# Patient Record
Sex: Female | Born: 1965
Health system: Southern US, Community
[De-identification: ages and names within clinical notes are randomized; demographics above are authoritative.]

## PROBLEM LIST (undated history)

## (undated) DIAGNOSIS — F419 Anxiety disorder, unspecified: Secondary | ICD-10-CM

## (undated) DIAGNOSIS — I4891 Unspecified atrial fibrillation: Secondary | ICD-10-CM

## (undated) DIAGNOSIS — F17200 Nicotine dependence, unspecified, uncomplicated: Secondary | ICD-10-CM

## (undated) DIAGNOSIS — F1090 Alcohol use, unspecified, uncomplicated: Secondary | ICD-10-CM

## (undated) DIAGNOSIS — Z789 Other specified health status: Secondary | ICD-10-CM

## (undated) DIAGNOSIS — Z7289 Other problems related to lifestyle: Secondary | ICD-10-CM

---

## 2019-12-18 ENCOUNTER — Ambulatory Visit: Payer: Managed Care, Other (non HMO) | Attending: Internal Medicine

## 2019-12-18 DIAGNOSIS — Z23 Encounter for immunization: Secondary | ICD-10-CM

## 2019-12-18 NOTE — Progress Notes (Signed)
   Covid-19 Vaccination Clinic  Name:  Navi Ewton    MRN: [id number] DOB: January 11, 1966  12/18/2019  Mr. Yankee was observed post Covid-19 immunization for 15 minutes without incident. He was provided with Vaccine Information Sheet and instruction to access the V-Safe system.   Mr. Leisner was instructed to call 911 with any severe reactions post vaccine: Marland Kitchen Difficulty breathing  . Swelling of face and throat  . A fast heartbeat  . A bad rash all over body  . Dizziness and weakness   Immunizations Administered    Name Date Dose VIS Date Route   Pfizer COVID-19 Vaccine 12/18/2019  9:10 AM 0.3 mL 09/17/2019 Intramuscular   Manufacturer: [employer], [employer]   Lot: [id number]   NDC: [address]-8

## 2020-01-11 ENCOUNTER — Ambulatory Visit: Payer: Managed Care, Other (non HMO)

## 2020-01-19 ENCOUNTER — Ambulatory Visit: Payer: Managed Care, Other (non HMO) | Attending: Internal Medicine

## 2020-01-19 DIAGNOSIS — Z23 Encounter for immunization: Secondary | ICD-10-CM

## 2020-01-19 NOTE — Progress Notes (Signed)
   Covid-19 Vaccination Clinic  Name:  Jane Sparks    MRN: [id number] DOB: 12-04-1965  01/19/2020  Mr. Harm was observed post Covid-19 immunization for 15 minutes without incident. He was provided with Vaccine Information Sheet and instruction to access the V-Safe system.   [name] was instructed to call 911 with any severe reactions post vaccine: Marland Kitchen Difficulty breathing  . Swelling of face and throat  . A fast heartbeat  . A bad rash all over body  . Dizziness and weakness   Immunizations Administered    Name Date Dose VIS Date Route   Pfizer COVID-19 Vaccine 01/19/2020  2:47 PM 0.3 mL 09/17/2019 Intramuscular   Manufacturer: [employer], [employer]   Lot: [id number]   NDC: [address]-3

## 2020-03-22 ENCOUNTER — Encounter (HOSPITAL_COMMUNITY): Payer: Self-pay | Admitting: Emergency Medicine

## 2020-03-22 ENCOUNTER — Observation Stay (HOSPITAL_COMMUNITY)
Admission: EM | Admit: 2020-03-22 | Discharge: 2020-03-23 | Disposition: A | Discharge status: Home / Self Care | Payer: Managed Care, Other (non HMO) | Attending: Family Medicine | Admitting: Family Medicine

## 2020-03-22 ENCOUNTER — Emergency Department (HOSPITAL_COMMUNITY): Payer: Managed Care, Other (non HMO)

## 2020-03-22 DIAGNOSIS — I4439 Other atrioventricular block: Secondary | ICD-10-CM | POA: Diagnosis not present

## 2020-03-22 DIAGNOSIS — Z8249 Family history of ischemic heart disease and other diseases of the circulatory system: Secondary | ICD-10-CM | POA: Insufficient documentation

## 2020-03-22 DIAGNOSIS — F17210 Nicotine dependence, cigarettes, uncomplicated: Secondary | ICD-10-CM | POA: Diagnosis not present

## 2020-03-22 DIAGNOSIS — I081 Rheumatic disorders of both mitral and tricuspid valves: Secondary | ICD-10-CM | POA: Diagnosis not present

## 2020-03-22 DIAGNOSIS — Z882 Allergy status to sulfonamides status: Secondary | ICD-10-CM | POA: Diagnosis not present

## 2020-03-22 DIAGNOSIS — F419 Anxiety disorder, unspecified: Secondary | ICD-10-CM | POA: Diagnosis not present

## 2020-03-22 DIAGNOSIS — J45909 Unspecified asthma, uncomplicated: Secondary | ICD-10-CM | POA: Diagnosis not present

## 2020-03-22 DIAGNOSIS — F1010 Alcohol abuse, uncomplicated: Secondary | ICD-10-CM | POA: Diagnosis not present

## 2020-03-22 DIAGNOSIS — R000 Tachycardia, unspecified: Secondary | ICD-10-CM

## 2020-03-22 DIAGNOSIS — I4892 Unspecified atrial flutter: Secondary | ICD-10-CM | POA: Diagnosis present

## 2020-03-22 DIAGNOSIS — E7800 Pure hypercholesterolemia, unspecified: Secondary | ICD-10-CM

## 2020-03-22 DIAGNOSIS — Z880 Allergy status to penicillin: Secondary | ICD-10-CM | POA: Insufficient documentation

## 2020-03-22 DIAGNOSIS — Z7982 Long term (current) use of aspirin: Secondary | ICD-10-CM | POA: Diagnosis not present

## 2020-03-22 DIAGNOSIS — Z823 Family history of stroke: Secondary | ICD-10-CM | POA: Diagnosis not present

## 2020-03-22 DIAGNOSIS — Z79899 Other long term (current) drug therapy: Secondary | ICD-10-CM | POA: Diagnosis not present

## 2020-03-22 DIAGNOSIS — I4891 Unspecified atrial fibrillation: Secondary | ICD-10-CM | POA: Diagnosis present

## 2020-03-22 DIAGNOSIS — F410 Panic disorder [episodic paroxysmal anxiety] without agoraphobia: Secondary | ICD-10-CM | POA: Diagnosis not present

## 2020-03-22 DIAGNOSIS — Z87891 Personal history of nicotine dependence: Secondary | ICD-10-CM

## 2020-03-22 DIAGNOSIS — Z789 Other specified health status: Secondary | ICD-10-CM

## 2020-03-22 DIAGNOSIS — Z20822 Contact with and (suspected) exposure to covid-19: Secondary | ICD-10-CM | POA: Insufficient documentation

## 2020-03-22 HISTORY — DX: Unspecified atrial fibrillation: I48.91

## 2020-03-22 HISTORY — DX: Other problems related to lifestyle: Z72.89

## 2020-03-22 HISTORY — DX: Nicotine dependence, unspecified, uncomplicated: F17.200

## 2020-03-22 HISTORY — DX: Other specified health status: Z78.9

## 2020-03-22 HISTORY — DX: Alcohol use, unspecified, uncomplicated: F10.90

## 2020-03-22 HISTORY — DX: Anxiety disorder, unspecified: F41.9

## 2020-03-22 LAB — BASIC METABOLIC PANEL
Anion gap: 11 (ref 5–15)
BUN: 13 mg/dL (ref 6–20)
CO2: 21 mmol/L — ABNORMAL LOW (ref 22–32)
Calcium: 9.3 mg/dL (ref 8.9–10.3)
Chloride: 107 mmol/L (ref 98–111)
Creatinine, Ser: 0.77 mg/dL (ref 0.44–1.00)
GFR calc Af Amer: >60 mL/min (ref >60)
GFR calc non Af Amer: >60 mL/min (ref >60)
Glucose, Bld: 95 mg/dL (ref 70–99)
Potassium: 5 mmol/L (ref 3.5–5.1)
Sodium: 139 mmol/L (ref 135–145)

## 2020-03-22 LAB — SARS CORONAVIRUS 2 BY RT PCR (HOSPITAL ORDER, PERFORMED IN ~~LOC~~ HOSPITAL LAB): SARS Coronavirus 2: NEGATIVE

## 2020-03-22 LAB — TROPONIN I (HIGH SENSITIVITY): Troponin I (High Sensitivity): 10 ng/L (ref <18)

## 2020-03-22 LAB — CBC
HCT: 47.1 % — ABNORMAL HIGH (ref 36.0–46.0)
Hemoglobin: 15.8 g/dL — ABNORMAL HIGH (ref 12.0–15.0)
MCH: 29.4 pg (ref 26.0–34.0)
MCHC: 33.5 g/dL (ref 30.0–36.0)
MCV: 87.7 fL (ref 80.0–100.0)
Platelets: 347 10*3/uL (ref 150–400)
RBC: 5.37 MIL/uL — ABNORMAL HIGH (ref 3.87–5.11)
RDW: 13.7 % (ref 11.5–15.5)
WBC: 7.2 10*3/uL (ref 4.0–10.5)
nRBC: 0 % (ref 0.0–0.2)

## 2020-03-22 LAB — ETHANOL: Alcohol, Ethyl (B): <10 mg/dL (ref <10)

## 2020-03-22 LAB — HIV ANTIBODY (ROUTINE TESTING W REFLEX): HIV Screen 4th Generation wRfx: NONREACTIVE

## 2020-03-22 MED ORDER — ENOXAPARIN SODIUM 40 MG/0.4ML ~~LOC~~ SOLN
40.0000 mg | SUBCUTANEOUS | Status: DC
  Administered 2020-03-22: 40 mg via SUBCUTANEOUS
  Filled 2020-03-22: qty 0.4

## 2020-03-22 MED ORDER — NICOTINE 14 MG/24HR TD PT24: 14.0000 mg | MEDICATED_PATCH | Freq: Every day | TRANSDERMAL | Status: DC | PRN

## 2020-03-22 MED ORDER — POLYETHYLENE GLYCOL 3350 17 G PO PACK: 17.0000 g | PACK | Freq: Every day | ORAL | Status: DC | PRN

## 2020-03-22 MED ORDER — VITAMIN D 25 MCG (1000 UNIT) PO TABS
1000.0000 [IU] | ORAL_TABLET | Freq: Every day | ORAL | Status: DC
  Administered 2020-03-22 – 2020-03-23 (×2): 1000 [IU] via ORAL
  Filled 2020-03-22 (×2): qty 1

## 2020-03-22 MED ORDER — ACETAMINOPHEN 650 MG RE SUPP: 650.0000 mg | Freq: Four times a day (QID) | RECTAL | Status: DC | PRN

## 2020-03-22 MED ORDER — ACETAMINOPHEN 325 MG PO TABS
650.0000 mg | ORAL_TABLET | Freq: Four times a day (QID) | ORAL | Status: DC | PRN
  Administered 2020-03-22 – 2020-03-23 (×2): 650 mg via ORAL
  Filled 2020-03-22 (×2): qty 2

## 2020-03-22 MED ORDER — DILTIAZEM HCL 30 MG PO TABS
30.0000 mg | ORAL_TABLET | Freq: Four times a day (QID) | ORAL | Status: DC
  Administered 2020-03-22 – 2020-03-23 (×4): 30 mg via ORAL
  Filled 2020-03-22 (×4): qty 1

## 2020-03-22 MED ORDER — SODIUM CHLORIDE 0.9% FLUSH: 3.0000 mL | Freq: Once | INTRAVENOUS | Status: DC

## 2020-03-22 MED ORDER — LORATADINE 10 MG PO TABS
10.0000 mg | ORAL_TABLET | Freq: Every day | ORAL | Status: DC
  Filled 2020-03-22 (×2): qty 1

## 2020-03-22 MED ORDER — DIPHENHYDRAMINE HCL 25 MG PO CAPS: 25.0000 mg | ORAL_CAPSULE | Freq: Four times a day (QID) | ORAL | Status: DC | PRN

## 2020-03-22 MED ORDER — DILTIAZEM HCL-DEXTROSE 125-5 MG/125ML-% IV SOLN (PREMIX)
5.0000 mg/h | INTRAVENOUS | Status: DC
  Administered 2020-03-22: 5 mg/h via INTRAVENOUS
  Filled 2020-03-22: qty 125

## 2020-03-22 MED ORDER — FAMOTIDINE 20 MG PO TABS: 20.0000 mg | ORAL_TABLET | Freq: Two times a day (BID) | ORAL | Status: DC | PRN

## 2020-03-22 MED ORDER — DILTIAZEM LOAD VIA INFUSION
10.0000 mg | Freq: Once | INTRAVENOUS | Status: AC
  Administered 2020-03-22: 10 mg via INTRAVENOUS
  Filled 2020-03-22: qty 10

## 2020-03-22 MED ORDER — DIPHENHYDRAMINE HCL 25 MG PO TABS
25.0000 mg | ORAL_TABLET | Freq: Four times a day (QID) | ORAL | Status: DC | PRN
  Filled 2020-03-22: qty 1

## 2020-03-22 MED ORDER — SODIUM CHLORIDE 0.9 % IV SOLN: INTRAVENOUS | Status: DC

## 2020-03-22 NOTE — ED Triage Notes (Signed)
Pt. Stated, I woke up feeling anxious and the I checked my APPle watch and it said I was in A-fib. So I went to [facility] and they sent me here.

## 2020-03-22 NOTE — ED Provider Notes (Addendum)
[place] EMERGENCY DEPARTMENT Provider Note   CSN: [id number] Arrival date & time: 03/22/20  1035     History Chief Complaint  Patient presents with  . Dizziness  . Atrial Fibrillation    Jane Sparks is a 54 y.o. female.  Patient's Apple Watch alerted her to her heart rate being atrial fibrillation.  Patient's had the apple watch for about 9 months.  Is never dieting like this before.  Past medical history is noncontributory.  Patient is never had any heart rate problems before.  She does say that she does have a history of panic attacks.  Not listed.  So she was initially a little confused whether that was what was going on.  She did have some lightheadedness never had any chest pain.  Patient went to urgent care based on the complaint was sent here for further evaluation.  EKG done out in triage was consistent with a atrial fib flutter.  With a rapid ventricular rate.        No past medical history on file.  There are no problems to display for this patient.      No family history on file.  Social History   Tobacco Use  . Smoking status: Current Every Day Smoker  . Smokeless tobacco: Never Used  Substance Use Topics  . Alcohol use: Yes  . Drug use: Not Currently    Home Medications Prior to Admission medications   Not on File    Allergies    Penicillins and Sulfa antibiotics  Review of Systems   Review of Systems  Constitutional: Negative for chills and fever.  HENT: Negative for congestion, rhinorrhea and sore throat.   Eyes: Negative for visual disturbance.  Respiratory: Negative for cough and shortness of breath.   Cardiovascular: Positive for palpitations. Negative for chest pain and leg swelling.  Gastrointestinal: Negative for abdominal pain, diarrhea, nausea and vomiting.  Genitourinary: Negative for dysuria.  Musculoskeletal: Negative for back pain and neck pain.  Skin: Negative for rash.  Neurological: Positive for  light-headedness. Negative for dizziness and headaches.  Hematological: Does not bruise/bleed easily.  Psychiatric/Behavioral: Negative for confusion.    Physical Exam Updated Vital Signs BP (!) 128/103 (BP Location: Right Arm)   Pulse 80   Temp 98.3 F (36.8 C) (Oral)   Resp 16   Ht 1.626 m (5\' 4" )   Wt 89.8 kg   SpO2 99%   BMI 33.99 kg/m   Physical Exam Vitals and nursing note reviewed.  Constitutional:      Appearance: Normal appearance. He is well-developed.  HENT:     Head: Normocephalic and atraumatic.     Mouth/Throat:     Mouth: Mucous membranes are moist.  Eyes:     Conjunctiva/sclera: Conjunctivae normal.     Pupils: Pupils are equal, round, and reactive to light.  Cardiovascular:     Rate and Rhythm: Tachycardia present. Rhythm irregular.     Heart sounds: No murmur heard.   Pulmonary:     Effort: Pulmonary effort is normal. No respiratory distress.     Breath sounds: Normal breath sounds.  Abdominal:     Palpations: Abdomen is soft.     Tenderness: There is no abdominal tenderness.  Musculoskeletal:        General: No swelling.     Cervical back: Normal range of motion and neck supple.  Skin:    General: Skin is warm and dry.     Capillary Refill: Capillary refill takes  less than 2 seconds.  Neurological:     General: No focal deficit present.     Mental Status: He is alert and oriented to person, place, and time.     Cranial Nerves: No cranial nerve deficit.     Sensory: No sensory deficit.     Motor: No weakness.     ED Results / Procedures / Treatments   Labs (all labs ordered are listed, but only abnormal results are displayed) Labs Reviewed  CBC  BASIC METABOLIC PANEL   Results for orders placed or performed during the hospital encounter of 03/22/20  Basic metabolic panel  Result Value Ref Range   Sodium 139 135 - 145 mmol/L   Potassium 5.0 3.5 - 5.1 mmol/L   Chloride 107 98 - 111 mmol/L   CO2 21 (L) 22 - 32 mmol/L   Glucose, Bld 95  70 - 99 mg/dL   BUN 13 6 - 20 mg/dL   Creatinine, Ser 5.18 0.61 - 1.24 mg/dL   Calcium 9.3 8.9 - 84.1 mg/dL   GFR calc non Af Amer >60 >60 mL/min   GFR calc Af Amer >60 >60 mL/min   Anion gap 11 5 - 15  CBC  Result Value Ref Range   WBC 7.2 4.0 - 10.5 K/uL   RBC 5.37 4.22 - 5.81 MIL/uL   Hemoglobin 15.8 13.0 - 17.0 g/dL   HCT 66.0 39 - 52 %   MCV 87.7 80.0 - 100.0 fL   MCH 29.4 26.0 - 34.0 pg   MCHC 33.5 30.0 - 36.0 g/dL   RDW 63.0 16.0 - 10.9 %   Platelets 347 150 - 400 K/uL   nRBC 0.0 0.0 - 0.2 %     EKG None  Radiology DG Chest 2 View  Result Date: 03/22/2020 CLINICAL DATA:  Atrial fibrillation, smoker in asthma. EXAM: CHEST - 2 VIEW COMPARISON:  None. FINDINGS: The heart size and mediastinal contours are within normal limits. The lungs are clear. No pneumothorax or pleural effusion. The visualized skeletal structures are unremarkable. IMPRESSION: No active cardiopulmonary disease. Electronically Signed   By: Emmaline Kluver M.D.   On: 03/22/2020 11:23    Procedures Procedures (including critical care time)  CRITICAL CARE Performed by: Vanetta Mulders Total critical care time: 45 minutes Critical care time was exclusive of separately billable procedures and treating other patients. Critical care was necessary to treat or prevent imminent or life-threatening deterioration. Critical care was time spent personally by me on the following activities: development of treatment plan with patient and/or surrogate as well as nursing, discussions with consultants, evaluation of patient's response to treatment, examination of patient, obtaining history from patient or surrogate, ordering and performing treatments and interventions, ordering and review of laboratory studies, ordering and review of radiographic studies, pulse oximetry and re-evaluation of patient's condition.   Medications Ordered in ED Medications  sodium chloride flush (NS) 0.9 % injection 3 mL (has no  administration in time range)  0.9 %  sodium chloride infusion (has no administration in time range)  diltiazem (CARDIZEM) 1 mg/mL load via infusion 10 mg (has no administration in time range)    And  diltiazem (CARDIZEM) 125 mg in dextrose 5% 125 mL (1 mg/mL) infusion (has no administration in time range)    ED Course  I have reviewed the triage vital signs and the nursing notes.  Pertinent labs & imaging results that were available during my care of the patient were reviewed by me and considered in my  medical decision making (see chart for details).    MDM Rules/Calculators/A&P                         EKG done back in the recess room showed atrial fib flutter.  With heart rate anywhere from 1 10-1 50.  Chest x-ray negative.  Electrolytes and complete metabolic panel without any significant abnormalities.  Patient will be given a 10 mg diltiazem bolus and then started on diltiazem drip.  Patient with known prior history of atrial fibrillation or flutter.  Patient's had both Covid vaccines the last one was about 5 weeks ago.  Patient without any Covid symptoms.  We will contact unassigned medicine service for admission.  Once we see that her heart rate is stabilized with the diltiazem drip.  Patient responding well with his diltiazem drip.  Still in atrial fibrillation.  But heart rate down into the 80s.  Discussed with family medicine for admission.  Final Clinical Impression(s) / ED Diagnoses Final diagnoses:  Atrial fibrillation and flutter [facility])    Rx / DC Orders ED Discharge Orders    None       Vanetta Mulders, MD 03/22/20 1202    Vanetta Mulders, MD 03/22/20 1257

## 2020-03-22 NOTE — Progress Notes (Signed)
Patient listed in chart as female.  Patient seen at bedside.  Discussed with patient and she reports that she is a female and was born as a female.  Will update demographics accordingly.  Luis Abed, D.O.  PGY-2 Family Medicine  03/22/2020 9:00 PM

## 2020-03-22 NOTE — ED Notes (Signed)
Attempted to call nursing report to [phone]  ?

## 2020-03-22 NOTE — H&P (Addendum)
Family Medicine Teaching [facility] Admission History and Physical Service Pager: [phone]  Patient name: Jane Sparks Medical record number: [id number] Date of birth: 01-23-1966 Age: 54 y.o. Gender: female  Primary Care Provider: Patient, No Pcp Per Consultants: cardiology  Code Status: DNR Preferred Emergency Contact: Andrey Campanile, husband, [phone]  Chief Complaint: Dizziness  Assessment and Plan: Jane Sparks is a 54 y.o. presenting for apple watch reporting atrial fibrillation/flutter. PMH is significant for anxiety, tobacco use disorder, alcohol use disorder.   New onset atrial flutter/fibrillation with RVR  Reports that she was having an argument with her mom last night and woke up this morning still angry. Patient had apple watch alert stating she was in atrial fibrillation around 730 this morning.  She felt as though she was anxious so she took 2 CBD pills but that did not cause any relief.  Her apple watch continue to alert her that she was in atrial flutter therefore she sought immediate care.  She was seen briefly at urgent care and sent to the ED.  EKG concerning for atrial fib/flutter with variable AV block (2-4:1).  Chest x-ray showed no active cardiopulmonary disease.  Patient given a 10 mg diltiazem bolus and then started on diltiazem drip.  Heart rate down trended from the 160s to the 80s.  Patient is asymptomatic, denying chest discomfort, palpitations, shortness of breath, previous similar symptoms, recent increased salt intake and recent drug use.  Endorses anxiety. Less likely infection given lack of infectious symptoms.  Not likely related to anemia as hemoglobin 15.8.  Electrolytes were within normal range. Patient denies history of PE/DVT and recent travel. Well score 1.5 No known hx of CHF and hsTroponin was10, less likely ACS.  Patient with a remote drug use.  UDS pending.  She drinks 2 alcoholic beverages in the evening.  Etiology of new onset atrial fibrillation  unclear, though could be secondary to anxiety or alcohol use.  CHADs VASc score of 1, low moderate risk.  Will discuss risk versus benefits of starting anticoagulation.  Patient may be amendable to this as her mother had a CVA and also has A. fib. -Admit to cardiac telemetry for observation, attending Dr. Deirdre Priest -Consider cardiology consult, if RVR returns -Continue Cardizem gtt, transition to oral therapy -Consider to DOAC -Monitor electrolytes, Mg >2, K >4 -Follow-up TSH -Follow-up ECHO -A1c -Lipid panel -UDS -Ethanol level -AM CBC, BMP -Continuous cardiopulmonary monitoring -Lovenox for VT prophylaxis -Vitals per unit routine -Regular diet  Allergic rhinitis Patient states she takes Claritin and Benadryl.  Reports congestion on admission. -Continue Claritin  Anxiety  Panic disorder Patient uses 2 pills of CBD when she feels anxious as needed.  Took 2 pills this morning.  Offered Atarax however patient declined. -Continue to monitor -Consider starting SSRI  Tobacco Use Disorder Patient smoking 1/2 ppd for the past 20 years.  Did quit while she was pregnant. - Nicotine patch available upon request   Alcohol use disorder Reports drinking 2 drinks per night.  Last drink last night.  Denies recent heavy drinking.  -Follow-up EtOH -Consider CIWA's.  FEN/GI: Regular diet, replete electrolytes as needed Prophylaxis: Lovenox   Disposition: Mid to cardiac telemetry  History of Present Illness:  Jane Sparks is a 54 y.o. with atrial fibrillation.  She has a previous medical history significant for anxiety and allergies.  Jane Sparks reports that she had an upsetting argument with her mother last night which left her anxious and angry.  She went to bed and woke up  this morning with continued anxiety and anger.  As these emotions persisted through the morning, she decided to check her pulse with her apple watch which showed that she was in atrial fibrillation.  At the behest of  her watch she presented to urgent care who immediately transported her to [facility] ED via ambulance.    Interaction with the medical field is very difficult for Jane Sparks because of the suffering her mother has gone through.  Her mother has suffered a severe stroke secondary to atrial fibrillation which is caused her about one third of her brain.  As result, she remains somewhat fearful and anxious about medicine and has taken significant steps to avoid seeking medical care for at least the past 10 years.  ED EKG concerning for atrial fibrillation/flutter with RVR.  Patient started on Cardizem gtt.  Admitted for overnight observation for resolution of her tachycardia.  Review Of Systems: Per HPI with the following additions:   Review of Systems  Constitutional: Negative for fever.  HENT: Positive for congestion. Negative for sore throat.   Eyes: Negative for blurred vision.  Respiratory: Negative for shortness of breath.   Cardiovascular: Negative for chest pain and palpitations.  Gastrointestinal: Negative for nausea and vomiting.  Genitourinary: Negative for dysuria.  Musculoskeletal: Negative for back pain and myalgias.  Skin: Negative for rash.  Neurological: Negative for dizziness and headaches.  Psychiatric/Behavioral: The patient is nervous/anxious.     Patient Active Problem List   Diagnosis Date Noted  . Atrial flutter ([place]) 03/22/2020    Past Medical History: No past medical history on file.  Past Surgical History: No recent surgeries.   Social History: Social History   Tobacco Use  . Smoking status: Current Every Day Smoker  . Smokeless tobacco: Never Used  Substance Use Topics  . Alcohol use: Yes  . Drug use: Not Currently   Additional social history: drinks two drinks/night. Drinks 1/2 PPD Please also refer to relevant sections of EMR.  Family History: Family History  Problem Relation Age of Onset  . Atrial fibrillation Mother   . CVA Mother   .  Heart disease Father    Mom - A-fib, CVA Dad - died of heart complications  Allergies and Medications: Allergies  Allergen Reactions  . Penicillins   . Sulfa Antibiotics    No current facility-administered medications on file prior to encounter.   Current Outpatient Medications on File Prior to Encounter  Medication Sig Dispense Refill  . aspirin EC 81 MG tablet Take 81 mg by mouth once. Swallow whole.    . calcium carbonate (TUMS - DOSED IN MG ELEMENTAL CALCIUM) 500 MG chewable tablet Chew 1 tablet by mouth daily as needed for indigestion or heartburn.    . cetirizine (ZYRTEC) 10 MG tablet Take 10 mg by mouth daily.    . cholecalciferol (VITAMIN D3) 25 MCG (1000 UNIT) tablet Take 1,000 Units by mouth daily.    . diphenhydrAMINE (BENADRYL) 25 MG tablet Take 25 mg by mouth every 6 (Sparks) hours as needed for allergies.    . famotidine (PEPCID) 20 MG tablet Take 20 mg by mouth 2 (two) times daily as needed for heartburn or indigestion.    Marland Kitchen OVER THE COUNTER MEDICATION Take 1 each by mouth See admin instructions. "CBD gummy" can take up to 5 daily.      Objective: BP 135/72 (BP Location: Right Arm)   Pulse (!) 51   Temp 97.7 F (36.5 C) (Oral)   Resp 18  Ht 5\' 4"  (1.626 m)   Wt 87.1 kg   SpO2 100%   BMI 32.96 kg/m   Exam: GEN:     alert, very anxious,  sitting upright in bed HENT:  mucus membranes moist, oropharyngeal without lesions or erythema,  nares patent, no nasal discharge  EYES:   pupils equal and reactive, EOM intact NECK:  supple, normal ROM, no lymphadenopathy, no goiter RESP:  clear to auscultation bilaterally, no increased work of breathing CVS:  Irregularly irregular rhythm, normal rate, no murmurs, rubs or gallops ABD:  soft, non-tender; bowel sounds present; no palpable masses EXT:   normal ROM, atraumatic, no lower extremity edema NEURO:  normal without focal findings,  speech normal, alert and oriented  Skin:   warm and dry, normal skin turgor Psych:  Anxious    Labs and Imaging: CBC BMET  Recent Labs  Lab 03/22/20 1105  WBC 7.2  HGB 15.8  HCT 47.1  PLT 347   Recent Labs  Lab 03/22/20 1105  NA 139  K 5.0  CL 107  CO2 21*  BUN 13  CREATININE 0.77  GLUCOSE 95  CALCIUM 9.3     EKG: Atrial fibrillation/flutter, variable AV block, HR 123    DG Chest 2 View  Result Date: 03/22/2020 CLINICAL DATA:  Atrial fibrillation, smoker in asthma. EXAM: CHEST - 2 VIEW COMPARISON:  None. FINDINGS: The heart size and mediastinal contours are within normal limits. The lungs are clear. No pneumothorax or pleural effusion. The visualized skeletal structures are unremarkable. IMPRESSION: No active cardiopulmonary disease. Electronically Signed   By: 03/24/2020 M.D.   On: 03/22/2020 11:23   03/24/2020, DO 03/22/2020, 8:21 PM PGY-1, [facility] FPTS Intern pager: [phone], text pages welcome  FPTS Upper-Level Resident Addendum   I have independently interviewed and examined the patient. I have discussed the above with the original author and agree with their documentation. My edits for correction/addition/clarification are in blue. Please see also any attending notes.    [phone] MD PGY-2, [place] 03/22/2020 8:22 PM  FPTS Service pager: [phone] (text pages welcome through [facility])

## 2020-03-22 NOTE — Hospital Course (Addendum)
  New onset atrial fibrillation/flutter Reports that she was having an argument with her mom last night and woke up this morning still angry. Patient had apple watch alert stating she was in atrial fibrillation around 730 this morning.  She felt as though she was anxious so she took 2 CBD pills but that did not cause any relief.  Her apple watch continue to alert her that she was in atrial flutter therefore she sought immediate care.  She was seen briefly at urgent care and sent to the ED.  EKG concerning for atrial fib/flutter.  Chest x-ray showed no active cardiopulmonary disease.  Patient given a 10 mg diltiazem bolus and then started on diltiazem drip.  Heart rate down trended from the 160s to the 80s.  Patient is asymptomatic, denying chest discomfort, palpitations, shortness of breath, previous similar symptoms, recent increased salt intake and recent drug use.  Endorses anxiety. Less likely infection given lack of infectious symptoms.  Not likely related to anemia as hemoglobin 15.8.  Electrolytes were within normal range. Patient denies history of PE/DVT and recent travel. Well score 1.5 No known hx of CHF and hsTroponin was10, less likely ACS.  Patient with a remote drug use.  UDS pending.  She drinks 2 alcoholic beverages in the evening.   Cardiology evaluated patient and recommended ***   Repeat CBC at follow up for concern for polycythemia on admission. If remains elevated, consider further work up for OSA.  Continue to encourage smoking cessation. Highly recommend stopping CBD supplementation as patient at increased risk of bleeding on Eliquis.  We recommend stopping Aspirin. You are being started on Eliquis to prevent clots due to your irregular heart rhythm due to Atrial flutter / fibrillation.  Continue taking your Cardizem 120 mg capsules daily to help with your heart rate.  Dr. Homero Fellers offered to be the patient's PCP.

## 2020-03-23 ENCOUNTER — Encounter (HOSPITAL_COMMUNITY): Payer: Self-pay

## 2020-03-23 ENCOUNTER — Observation Stay (HOSPITAL_BASED_OUTPATIENT_CLINIC_OR_DEPARTMENT_OTHER): Payer: Managed Care, Other (non HMO)

## 2020-03-23 ENCOUNTER — Encounter (HOSPITAL_COMMUNITY): Payer: Self-pay | Admitting: Family Medicine

## 2020-03-23 DIAGNOSIS — R000 Tachycardia, unspecified: Secondary | ICD-10-CM

## 2020-03-23 DIAGNOSIS — Z87891 Personal history of nicotine dependence: Secondary | ICD-10-CM

## 2020-03-23 DIAGNOSIS — F419 Anxiety disorder, unspecified: Secondary | ICD-10-CM

## 2020-03-23 DIAGNOSIS — I340 Nonrheumatic mitral (valve) insufficiency: Secondary | ICD-10-CM

## 2020-03-23 DIAGNOSIS — Z720 Tobacco use: Secondary | ICD-10-CM

## 2020-03-23 DIAGNOSIS — I361 Nonrheumatic tricuspid (valve) insufficiency: Secondary | ICD-10-CM | POA: Diagnosis not present

## 2020-03-23 DIAGNOSIS — Z789 Other specified health status: Secondary | ICD-10-CM

## 2020-03-23 DIAGNOSIS — I4892 Unspecified atrial flutter: Secondary | ICD-10-CM | POA: Diagnosis not present

## 2020-03-23 DIAGNOSIS — E7800 Pure hypercholesterolemia, unspecified: Secondary | ICD-10-CM

## 2020-03-23 DIAGNOSIS — Z7289 Other problems related to lifestyle: Secondary | ICD-10-CM

## 2020-03-23 DIAGNOSIS — I4891 Unspecified atrial fibrillation: Secondary | ICD-10-CM | POA: Diagnosis not present

## 2020-03-23 LAB — BASIC METABOLIC PANEL
Anion gap: 11 (ref 5–15)
BUN: 11 mg/dL (ref 6–20)
CO2: 19 mmol/L — ABNORMAL LOW (ref 22–32)
Calcium: 9 mg/dL (ref 8.9–10.3)
Chloride: 108 mmol/L (ref 98–111)
Creatinine, Ser: 0.84 mg/dL (ref 0.44–1.00)
GFR calc Af Amer: >60 mL/min (ref >60)
GFR calc non Af Amer: >60 mL/min (ref >60)
Glucose, Bld: 93 mg/dL (ref 70–99)
Potassium: 3.9 mmol/L (ref 3.5–5.1)
Sodium: 138 mmol/L (ref 135–145)

## 2020-03-23 LAB — RAPID URINE DRUG SCREEN, HOSP PERFORMED
Amphetamines: NOT DETECTED
Barbiturates: NOT DETECTED
Benzodiazepines: NOT DETECTED
Cocaine: NOT DETECTED
Opiates: NOT DETECTED
Tetrahydrocannabinol: NOT DETECTED

## 2020-03-23 LAB — LIPID PANEL
Cholesterol: 165 mg/dL (ref 0–200)
HDL: 32 mg/dL — ABNORMAL LOW (ref >40)
LDL Cholesterol: 108 mg/dL — ABNORMAL HIGH (ref 0–99)
Total CHOL/HDL Ratio: 5.2 RATIO
Triglycerides: 127 mg/dL (ref <150)
VLDL: 25 mg/dL (ref 0–40)

## 2020-03-23 LAB — ECHOCARDIOGRAM COMPLETE
Height: 64 in
Weight: 3056 oz

## 2020-03-23 LAB — MAGNESIUM: Magnesium: 1.8 mg/dL (ref 1.7–2.4)

## 2020-03-23 LAB — TSH: TSH: 3.062 u[IU]/mL (ref 0.350–4.500)

## 2020-03-23 MED ORDER — MAGNESIUM SULFATE 2 GM/50ML IV SOLN
2.0000 g | Freq: Once | INTRAVENOUS | Status: AC
  Administered 2020-03-23: 2 g via INTRAVENOUS
  Filled 2020-03-23: qty 50

## 2020-03-23 MED ORDER — APIXABAN 5 MG PO TABS: 5.0000 mg | ORAL_TABLET | Freq: Two times a day (BID) | ORAL | 0 refills | Status: DC

## 2020-03-23 MED ORDER — NICOTINE 14 MG/24HR TD PT24: 14.0000 mg | MEDICATED_PATCH | Freq: Every day | TRANSDERMAL | 0 refills | Status: DC | PRN

## 2020-03-23 MED ORDER — DILTIAZEM HCL ER COATED BEADS 120 MG PO CP24: 120.0000 mg | ORAL_CAPSULE | Freq: Every day | ORAL | 3 refills | Status: DC

## 2020-03-23 MED ORDER — APIXABAN 5 MG PO TABS
5.0000 mg | ORAL_TABLET | Freq: Two times a day (BID) | ORAL | Status: DC
  Administered 2020-03-23: 5 mg via ORAL
  Filled 2020-03-23: qty 1

## 2020-03-23 MED FILL — CARTIA XT 120 MG CP24: 120 | 30 days supply | Qty: 30 | Fill #0

## 2020-03-23 MED FILL — NICOTINE 14 MG/24HR PATCH: 14 | 28 days supply | Qty: 28 | Fill #0

## 2020-03-23 MED FILL — ELIQUIS 5 MG TABLET: 5 | 30 days supply | Qty: 60 | Fill #0

## 2020-03-23 NOTE — Care Management (Signed)
Message sent to [facility] regarding eligibility pre authorization for Eliquis.

## 2020-03-23 NOTE — TOC Benefit Eligibility Note (Signed)
Transition of Care [facility]) Benefit Eligibility Note    Patient Details  Name: Santos Hardwick MRN: [id number] Date of Birth: [birth year]   Medication/Dose: Eliquis 58m.bid  Covered?: No        Spoke with Person/Company/Phone Number:: JSkipper Cliche W/[employer] [phone]    Prior Approval: Yes [phone]  Deductible: MBelenda CruisePhone Number: 03/23/2020, 10:47 AM

## 2020-03-23 NOTE — Progress Notes (Signed)
  Echocardiogram 2D Echocardiogram has been performed.  Jane Sparks 03/23/2020, 8:51 AM

## 2020-03-23 NOTE — Care Management Note (Cosign Needed)
Case Management Note  Patient Details  Name: Jane Sparks MRN: [id number] Date of Birth: [birth year]  Subjective/Objective:                  Pre- Authorization from MD required by insurance prior to discharge for eliquis  Action/Plan: Please call [phone] to pre authorize  Expected Discharge Date:  03/23/20              Expected Discharge Plan:     In-House Referral:     Discharge planning Services     Post Acute Care Choice:    Choice offered to:     DME Arranged:    DME Agency:     HH Arranged:    HH Agency:     Status of Service:     If discussed at [employer], dates discussed:    Additional Comments:  Lockie Pares, RN 03/23/2020, 3:33 PM

## 2020-03-23 NOTE — Progress Notes (Addendum)
Family Medicine Teaching Service Daily Progress Note Intern Pager: [phone]  Patient name: Jane Sparks Medical record number: [id number] Date of birth: [birth year] Age: 54 y.o. Gender: female  Primary Care Provider: Patient, No Pcp Per Consultants: None Code Status: DNR  Pt Overview and Major Events to Date:  03/22/20: Admitted, cardizem gtt    Assessment and Plan: Marabella Popiel is a 54 y.o. female who presented  for apple watch reporting atrial fibrillation/flutter. PMH is significant for anxiety, tobacco use disorder, alcohol use disorder.   New onset atrial flutter/fibrillation with RVR, variable AV block  Pt's with asymptomatic new onset atrial flutter.  Patient converted to oral therapy (cadizem 30 mg q6h) around 1700 and Cardizeim gtt discontinued around 1900. HR reached 140s (per tele) since transitioning. ECHO tech in the room, this morning. Patient eager to leave. Will consult cardiology to help with dosing. AM EKG not completed as of yet. Etiology not infectious, related to anemia, not drug associated as UDS unremarkable. EtOH level unremarkable. Electrolytes remain stable.  High-sensitivity troponin not consistent with ACS.  Not likely related to hypothyroidism.  TSH 3.06.  Patient reports a start [place] this morning. -Cardiology consulted, appreciate recommended  -Continue Cardizem  -Start Eliquis -Monitor electrolytes, Mg >2, K >4 -Follow-up ECHO -Follow-up A1c -AM labs: CBC, BMP, Mg -Continuous cardiopulmonary monitoring  Code Status  Patient very anxious yesterday but was adamant she was DNR. - reassess code status.   Health maintenance Patient has not seen a physician in over 10 years.  Not have a PCP. Offer to follow up with [facility] and patient agreeable.  Lipid panel: Total cholesterol 165, triglycerides 127, HDL 32, LDL 108 A1c: Pending - outpt colonoscopy   Allergic rhinitis Patient states she takes Claritin and Benadryl.  Reported congestion on  admission. -Continue Claritin daily, Benadryl prn  Anxiety  Panic disorder Patient uses 2 pills of CBD when she feels anxious as needed. Offered Atarax however patient declined. -Continue to monitor -Consider starting SSRI  Tobacco Use Disorder Patient smoking 1/2 ppd for the past 20 years.  - Nicotine patch available upon request   Alcohol use disorder Reports drinking 2 drinks per night.  Last drink 6/15.  EtOH negative.   -Consider CIWA's.  FEN/GI: Regular diet, replete electrolytes as needed Prophylaxis: Lovenox   Disposition: likely home pending medical optimization   Subjective:  Patient remains anxious this morning.  Ready to leave the hospital.  Objective: Temp:  [97.6 F (36.4 C)-98.3 F (36.8 C)] 97.6 F (36.4 C) (06/16 2336) Pulse Rate:  [26-197] 52 (06/16 2336) Resp:  [11-34] 18 (06/16 2013) BP: (102-198)/(52-177) 107/52 (06/16 2336) SpO2:  [93 %-100 %] 98 % (06/16 2336) Weight:  [87.1 kg-89.8 kg] 87.1 kg (06/16 1546) Physical Exam: General: Anxious, alert, no acute distress Cardiovascular: Irregularly irregular rhythm, normal rate, no murmurs, rubs or gallops Respiratory: There to auscultation, no increased work of breathing Abdomen: Soft, nontender nondistended Extremities: Lower extremity edema, nontender  Laboratory: Recent Labs  Lab 03/22/20 1105  WBC 7.2  HGB 15.8*  HCT 47.1*  PLT 347   Recent Labs  Lab 03/22/20 1105  NA 139  K 5.0  CL 107  CO2 21*  BUN 13  CREATININE 0.77  CALCIUM 9.3  GLUCOSE 95     Imaging/Diagnostic Tests: DG Chest 2 View  Result Date: 03/22/2020 CLINICAL DATA:  Atrial fibrillation, smoker in asthma. EXAM: CHEST - 2 VIEW COMPARISON:  None. FINDINGS: The heart size and mediastinal contours are within normal limits. The lungs are  clear. No pneumothorax or pleural effusion. The visualized skeletal structures are unremarkable. IMPRESSION: No active cardiopulmonary disease. Electronically Signed   By: Emmaline Kluver M.D.   On: 03/22/2020 11:23     Katha Cabal, DO 03/23/2020, 1:20 AM PGY-1, [facility] FPTS Intern pager: [phone], text pages welcome

## 2020-03-23 NOTE — Progress Notes (Signed)
Around 0537, A.fib with RVR (130s-150s) noted less than 30 seconds. Vital signs are stable. Patient is awake and watching a movie. Patient denies any discomfort. Scheduled Cardizem 30 mg po given. Heart rate is between 60s-100s at this time. Will continue to assess.

## 2020-03-23 NOTE — Discharge Summary (Signed)
Family Medicine Teaching [facility] Discharge Summary  Patient name: Jane Sparks Medical record number: [id number] Date of birth: [birth year] Age: 54 y.o. Gender: female Date of Admission: 03/22/2020  Date of Discharge: 03/23/20  Admitting Physician: Katha Cabal, DO  Primary Care Provider: Mirian Mo, MD Consultants: Cardiology  Indication for Hospitalization: Atrial fibrillation/flutter  Discharge Diagnoses/Problem List:  Atrial fibrillation/flutter Alcohol use disorder Tobacco use disorder Panic disorder Anxiety Allergic rhinitis  Disposition: Home  Discharge Condition: Stable  Discharge Exam:  General: Anxious, alert, no acute distress Cardiovascular: Irregularly irregular rhythm, normal rate, no murmurs, rubs or gallops Respiratory: There to auscultation, no increased work of breathing Abdomen: Soft, nontender nondistended Extremities: Lower extremity edema, non-tender Taken Dr. Lezlie Octave progress note on day of discharge   Brief Hospital Course:     New onset atrial fibrillation/flutter Jane Sparks is a 54 y.o. female who presented  for apple watch reporting atrial fibrillation/flutter. PMH is significant foranxiety, tobacco use disorder,alcohol use disorder   New onset atrial flutter/fibrillation with RVR, variable AV block  On the day of admission, patient's Apple Watch continuously told her she was in atrial flutter. She was seen briefly at urgent care and sent to the ED. ED EKG was concerning for atrial fib/flutter. Chest x-ray showed no active cardiopulmonary disease.  Patient given a 10 mg diltiazem bolus and then started on diltiazem drip.  Heart rate down trended from the 160s to the 80s..  Patient remained asymptomatic. Patient converted to oral therapy (cadizem 30 mg q6h) around 1700 and Cardizeim gtt discontinued around 1900. HR reached 140s (per telemetry) since transitioning.  Patient started on DOAC.  Cardiology evaluated patient and  recommended cTEE however patient declined.  Patient transitioned to Cardizem 120 mg capsules daily.  ECHO with EF 55% and inferior basal hypokinesis.  In the setting of atrial flutter, the inferior basal area is poorly visualized.  Plan to repeat ECHO outpatient. Plan for patient to follow up in AFIB clinic for potential cardioversion, if not already converted to sinus rhythm.   Health maintenance Dr. Homero Fellers offered to be the patient's PCP. Patient to follow up in [facility] clinc.  Issues for Follow Up:   Cardiology plans to follow up in AFIB clinic for potential cardioversion and medication mangement, if not in sinus rhythm.    Repeat CBC at follow up for concern for polycythemia on admission. If remains elevated, consider further work up for OSA.   Continue to encourage smoking cessation.  Highly recommend stopping CBD and ASA use as patient at increased risk of bleeding on Eliquis.   Recommend starting SSRI for anxiety.  Follow up a1c.  Patient needs colonoscopy and mammogram.   Significant Procedures:   Significant Labs and Imaging:  Recent Labs  Lab 03/22/20 1105  WBC 7.2  HGB 15.8*  HCT 47.1*  PLT 347   Recent Labs  Lab 03/22/20 1105 03/23/20 0411 03/23/20 1122  NA 139  --  138  K 5.0  --  3.9  CL 107  --  108  CO2 21*  --  19*  GLUCOSE 95  --  93  BUN 13  --  11  CREATININE 0.77  --  0.84  CALCIUM 9.3  --  9.0  MG  --  1.8  --     ECHOCARDIOGRAM COMPLETE  Result Date: 03/23/2020    ECHOCARDIOGRAM REPORT   Patient Name:   Jane Sparks Date of Exam: 03/23/2020 Medical Rec #:  [id number]     Height:  64.0 in Accession #:    [id number]    Weight:       191.0 lb Date of Birth:  Jan 14, 1966    BSA:          1.918 m Patient Age:    53 years      BP:           127/70 mmHg Patient Gender: F             HR:           79 bpm. Exam Location:  Inpatient Procedure: 2D Echo, Cardiac Doppler and Color Doppler Indications:    Atrial Flutter 427.32 / I48.92  History:         Patient has no prior history of Echocardiogram examinations.                 Arrythmias:Atrial Flutter; Risk Factors:Current Smoker.  Sonographer:    Renella Cunas RDCS Referring Phys: [id number] SCOTT ZACKOWSKI IMPRESSIONS  1. Inferior basal hypokinesis . Left ventricular ejection fraction, by estimation, is 55%. The left ventricle has normal function. The left ventricle demonstrates regional wall motion abnormalities (see scoring diagram/findings for description). Left ventricular diastolic parameters are indeterminate.  2. Right ventricular systolic function is normal. The right ventricular size is normal. There is normal pulmonary artery systolic pressure.  3. Left atrial size was moderately dilated.  4. The mitral valve is normal in structure. Mild mitral valve regurgitation. No evidence of mitral stenosis.  5. The aortic valve is tricuspid. Aortic valve regurgitation is trivial. Mild aortic valve sclerosis is present, with no evidence of aortic valve stenosis.  6. The inferior vena cava is normal in size with greater than 50% respiratory variability, suggesting right atrial pressure of 3 mmHg. FINDINGS  Left Ventricle: Inferior basal hypokinesis. Left ventricular ejection fraction, by estimation, is 55%. The left ventricle has normal function. The left ventricle demonstrates regional wall motion abnormalities. The left ventricular internal cavity size was normal in size. There is no left ventricular hypertrophy. Left ventricular diastolic parameters are indeterminate. Right Ventricle: The right ventricular size is normal. No increase in right ventricular wall thickness. Right ventricular systolic function is normal. There is normal pulmonary artery systolic pressure. The tricuspid regurgitant velocity is 1.85 m/s, and  with an assumed right atrial pressure of 3 mmHg, the estimated right ventricular systolic pressure is 16.7 mmHg. Left Atrium: Left atrial size was moderately dilated. Right Atrium: Right atrial size was  normal in size. Pericardium: There is no evidence of pericardial effusion. Mitral Valve: The mitral valve is normal in structure. Normal mobility of the mitral valve leaflets. Mild mitral valve regurgitation. No evidence of mitral valve stenosis. Tricuspid Valve: The tricuspid valve is normal in structure. Tricuspid valve regurgitation is mild . No evidence of tricuspid stenosis. Aortic Valve: The aortic valve is tricuspid. Aortic valve regurgitation is trivial. Mild aortic valve sclerosis is present, with no evidence of aortic valve stenosis. Pulmonic Valve: The pulmonic valve was normal in structure. Pulmonic valve regurgitation is not visualized. No evidence of pulmonic stenosis. Aorta: The aortic root is normal in size and structure. Venous: The inferior vena cava is normal in size with greater than 50% respiratory variability, suggesting right atrial pressure of 3 mmHg. IAS/Shunts: The interatrial septum was not well visualized.  LEFT VENTRICLE PLAX 2D LVIDd:         4.30 cm LVIDs:         3.20 cm LV PW:  0.70 cm LV IVS:        0.70 cm LVOT diam:     2.00 cm LV SV:         49 LV SV Index:   26 LVOT Area:     3.14 cm  LV Volumes (MOD) LV vol d, MOD A2C: 66.4 ml LV vol d, MOD A4C: 71.7 ml LV vol s, MOD A2C: 36.6 ml LV vol s, MOD A4C: 31.3 ml LV SV MOD A2C:     29.8 ml LV SV MOD A4C:     71.7 ml LV SV MOD BP:      35.2 ml RIGHT VENTRICLE RV S prime:     10.30 cm/s TAPSE (M-mode): 1.7 cm LEFT ATRIUM           Index       RIGHT ATRIUM           Index LA diam:      4.30 cm 2.24 cm/m  RA Area:     10.20 cm LA Vol (A2C): 24.7 ml 12.87 ml/m RA Volume:   18.00 ml  9.38 ml/m LA Vol (A4C): 24.6 ml 12.82 ml/m  AORTIC VALVE LVOT Vmax:   83.60 cm/s LVOT Vmean:  60.500 cm/s LVOT VTI:    0.157 m  AORTA Ao Asc diam: 3.10 cm TRICUSPID VALVE TR Peak grad:   13.7 mmHg TR Vmax:        185.00 cm/s  SHUNTS Systemic VTI:  0.16 m Systemic Diam: 2.00 cm Charlton Haws MD Electronically signed by Charlton Haws MD Signature  Date/Time: 03/23/2020/4:47:33 PM    Final      Results/Tests Pending at Time of Discharge:  a1c (Lab machine down had to be sent to [facility] main lab)  Discharge Medications:  Allergies as of 03/23/2020      Reactions   Penicillins    Sulfa Antibiotics       Medication List    STOP taking these medications   aspirin EC 81 MG tablet   OVER THE COUNTER MEDICATION     TAKE these medications   apixaban 5 MG Tabs tablet Commonly known as: ELIQUIS Take 1 tablet (5 mg total) by mouth 2 (two) times daily.   calcium carbonate 500 MG chewable tablet Commonly known as: TUMS - dosed in mg elemental calcium Chew 1 tablet by mouth daily as needed for indigestion or heartburn.   cetirizine 10 MG tablet Commonly known as: ZYRTEC Take 10 mg by mouth daily.   cholecalciferol 25 MCG (1000 UNIT) tablet Commonly known as: VITAMIN D3 Take 1,000 Units by mouth daily.   diltiazem 120 MG 24 hr capsule Commonly known as: Cardizem CD Take 1 capsule (120 mg total) by mouth daily.   diphenhydrAMINE 25 MG tablet Commonly known as: BENADRYL Take 25 mg by mouth every 6 (six) hours as needed for allergies.   famotidine 20 MG tablet Commonly known as: PEPCID Take 20 mg by mouth 2 (two) times daily as needed for heartburn or indigestion.   nicotine 14 mg/24hr patch Commonly known as: NICODERM CQ - dosed in mg/24 hours Place 1 patch (14 mg total) onto the skin daily as needed (nictotine dependence).       Discharge Instructions: Please refer to Patient Instructions section of EMR for full details.  Patient was counseled important signs and symptoms that should prompt return to medical care, changes in medications, dietary instructions, activity restrictions, and follow up appointments.   Follow-Up Appointments:  Follow-up Information  Bonnita Hollow, MD. Go on 03/31/2020.   Specialty: Family Medicine Why: at 10:10 AM  Contact information: [address] [place]  [address] [phone]        Buford Dresser, MD. Schedule an appointment as soon as possible for a visit.   Specialty: Cardiology Contact information: [address] [place] [address] [phone]               Lyndee Hensen, DO 03/23/2020, 5:57 PM PGY-1, [place]

## 2020-03-23 NOTE — Progress Notes (Signed)
Transitions of Care Pharmacist Note  Jane Sparks is a 54 y.o. female that has been diagnosed with A Fib and will be prescribed Eliquis (apixaban) at discharge.   Patient Education: I provided the following education on Eliquis to the patient: How to take the medication Described what the medication is Signs of bleeding Signs/symptoms of VTE and stroke  Answered their questions  Discharge Medications Plan: The patient wants to have their discharge medications filled by the Transitions of Care pharmacy rather than their usual pharmacy.  The discharge orders pharmacy has been changed to the Transitions of Care pharmacy, the patient will receive a phone call regarding co-pay, and their medications will be delivered by the Transitions of Care pharmacy.    Thank you,   Charlett Nose, PharmD  PGY1 Acute Care Pharmacy Resident March 23, 2020

## 2020-03-23 NOTE — Progress Notes (Signed)
Progress note  Spoke with cardiology regarding patient's echo showing inferior basal hypokinesis.  Stated this can be seen in arrhythmia or with tachycardia.  Recommend that the patient follow-up with them in A. fib clinic with plans to eventually repeat the echocardiogram.  No further management at this time.  Peggyann Shoals, DO [facility], PGY-2 03/23/2020 6:01 PM

## 2020-03-23 NOTE — Consult Note (Signed)
Cardiology Consultation:   Patient ID: Jane Sparks MRN: [id number]; DOB: 03/10/1966  Admit date: 03/22/2020 Date of Consult: 03/23/2020  Primary Care Provider: Patient, No Pcp Per [facility] Cardiologist: Jodelle Red, MD new [facility] Electrophysiologist:  None    Patient Profile:   Jane Sparks is a 54 y.o. female with a hx of anxiety, tobacco use, and alcohol use disorder who is being seen today for the evaluation of new onset Afib at the request of Dr. Manson Passey.  History of Present Illness:   Jane Sparks presented to [facility] with onset of dizziness. She had a fight with her mother last night and woke up angry. Her Apple watch notified her she was in Afib at 0730 today. She felt anxiety and took 2 CBD pills without relief. She presented to UC and was subsequently sent to the ER. On presentation, EDP noted patient was largely asymptomatic, but was in what appeared to be atrial fibrillation/flutter with RVR - rates in the 140-160s. She received 10 mg cardizem IV bolus followed by cardizem drip. Cardizem drip was transitioned to PO dosing, but she was not rate controlled. Cardiology was consulted.   On my interview, she denied chest pain, palpitations, sensation of rapid heart rate, recent weight gain or weight loss, recent illness, recent drug use, and syncope. She felt short of breath this morning which increased her anxiety. Telemetry reveals what appears to be coarse Afib generally in the 60-70s, but RVR in the 140s with any movement. She has not walked in the halls yet. She smokes cigarettes.      Past Medical History:  Diagnosis Date  . Alcohol use   . Anxiety   . Atrial fibrillation with RVR (HCC)   . Smoker     History reviewed. No pertinent surgical history.   Home Medications:  Prior to Admission medications   Medication Sig Start Date End Date Taking? Authorizing Provider  aspirin EC 81 MG tablet Take 81 mg by mouth once. Swallow whole.   Yes [provider]  calcium carbonate (TUMS - DOSED IN MG ELEMENTAL CALCIUM) 500 MG chewable tablet Chew 1 tablet by mouth daily as needed for indigestion or heartburn.   Yes [provider]  cetirizine (ZYRTEC) 10 MG tablet Take 10 mg by mouth daily.   Yes [provider]  cholecalciferol (VITAMIN D3) 25 MCG (1000 UNIT) tablet Take 1,000 Units by mouth daily.   Yes [provider]  diphenhydrAMINE (BENADRYL) 25 MG tablet Take 25 mg by mouth every 6 (six) hours as needed for allergies.   Yes [provider]  famotidine (PEPCID) 20 MG tablet Take 20 mg by mouth 2 (two) times daily as needed for heartburn or indigestion.   Yes [provider]  OVER THE COUNTER MEDICATION Take 1 each by mouth See admin instructions. "CBD gummy" can take up to 5 daily.   Yes [provider]    Inpatient Medications: Scheduled Meds: . apixaban  5 mg Oral BID  . cholecalciferol  1,000 Units Oral Daily  . diltiazem  30 mg Oral Q6H  . loratadine  10 mg Oral Daily   Continuous Infusions: . magnesium sulfate bolus IVPB     PRN Meds: acetaminophen **OR** acetaminophen, diphenhydrAMINE, famotidine, nicotine, polyethylene glycol  Allergies:    Allergies  Allergen Reactions  . Penicillins   . Sulfa Antibiotics     Social History:   Social History   Socioeconomic History  . Marital status: Married    Spouse name:  Not on file  . Number of children: Not on file  . Years of education: Not on file  . Highest education level: Not on file  Occupational History  . Not on file  Tobacco Use  . Smoking status: Current Every Day Smoker  . Smokeless tobacco: Never Used  Substance and Sexual Activity  . Alcohol use: Yes  . Drug use: Not Currently  . Sexual activity: Not on file  Other Topics Concern  . Not on file  Social History Narrative  . Not on file   Social Determinants of Health   Financial Resource Strain:   . Difficulty of Paying Living Expenses:     Food Insecurity:   . Worried About Programme researcher, broadcasting/film/video in the Last Year:   . Barista in the Last Year:   Transportation Needs:   . Freight forwarder (Medical):   Marland Kitchen Lack of Transportation (Non-Medical):   Physical Activity:   . Days of Exercise per Week:   . Minutes of Exercise per Session:   Stress:   . Feeling of Stress :   Social Connections:   . Frequency of Communication with Friends and Family:   . Frequency of Social Gatherings with Friends and Family:   . Attends Religious Services:   . Active Member of Clubs or Organizations:   . Attends Banker Meetings:   Marland Kitchen Marital Status:   Intimate Partner Violence:   . Fear of Current or Ex-Partner:   . Emotionally Abused:   Marland Kitchen Physically Abused:   . Sexually Abused:     Family History:    Family History  Problem Relation Age of Onset  . Atrial fibrillation Mother   . CVA Mother   . Heart disease Father      ROS:  Please see the history of present illness.   All other ROS reviewed and negative.     Physical Exam/Data:   Vitals:   03/22/20 2336 03/23/20 0501 03/23/20 0632 03/23/20 0800  BP: (!) 107/52 129/88  127/70  Pulse: (!) 52 (!) 44 79 77  Resp:  18  18  Temp: 97.6 F (36.4 C) 97.7 F (36.5 C)  98.3 F (36.8 C)  TempSrc: Oral Oral  Oral  SpO2: 98% 100%  97%  Weight:  86.6 kg    Height:        Intake/Output Summary (Last 24 hours) at 03/23/2020 1240 Last data filed at 03/23/2020 0500 Gross per 24 hour  Intake 722.5 ml  Output 1100 ml  Net -377.5 ml   Last 3 Weights 03/23/2020 03/22/2020 03/22/2020  Weight (lbs) 191 lb 192 lb 198 lb  Weight (kg) 86.637 kg 87.091 kg 89.812 kg     Body mass index is 32.79 kg/m.  General:  Well nourished, well developed, in no acute distress HEENT: normal Neck: no JVD Vascular: No carotid bruits Cardiac:  Irregular rhythm, regular rate Lungs:  clear to auscultation bilaterally, no wheezing, rhonchi or rales  Abd: soft, nontender, no  hepatomegaly  Ext: no edema Musculoskeletal:  No deformities, BUE and BLE strength normal and equal Skin: warm and dry  Neuro:  CNs 2-12 intact, no focal abnormalities noted Psych:  Normal affect   EKG:  The EKG was personally reviewed and demonstrates:  Atrial flutter vs atrial fibrillation with ventricular rate 154 Telemetry:  Telemetry was personally reviewed and demonstrates:  Coarse Afib vs flutter HR int he 60-70 --> 140s with movement  Relevant CV Studies:  Echo  Final read pending  Laboratory Data:  High Sensitivity Troponin:   Recent Labs  Lab 03/22/20 1524  TROPONINIHS 10     Chemistry Recent Labs  Lab 03/22/20 1105  NA 139  K 5.0  CL 107  CO2 21*  GLUCOSE 95  BUN 13  CREATININE 0.77  CALCIUM 9.3  GFRNONAA >60  GFRAA >60  ANIONGAP 11    No results for input(s): PROT, ALBUMIN, AST, ALT, ALKPHOS, BILITOT in the last 168 hours. Hematology Recent Labs  Lab 03/22/20 1105  WBC 7.2  RBC 5.37*  HGB 15.8*  HCT 47.1*  MCV 87.7  MCH 29.4  MCHC 33.5  RDW 13.7  PLT 347   BNPNo results for input(s): BNP, PROBNP in the last 168 hours.  DDimer No results for input(s): DDIMER in the last 168 hours.   Radiology/Studies:  DG Chest 2 View  Result Date: 03/22/2020 CLINICAL DATA:  Atrial fibrillation, smoker in asthma. EXAM: CHEST - 2 VIEW COMPARISON:  None. FINDINGS: The heart size and mediastinal contours are within normal limits. The lungs are clear. No pneumothorax or pleural effusion. The visualized skeletal structures are unremarkable. IMPRESSION: No active cardiopulmonary disease. Electronically Signed   By: Audie Pinto M.D.   On: 03/22/2020 11:23   {  Assessment and Plan:   New onset atrial fibrillation/flutter - remains in Afib/flutter, RVR with any movement - cardizem 30 mg q6hr - transition this to 120 mg cardizem pending echo read - electrolytes WNL - TSH WNL - Mg 1.8 - HS troponin negative - ETOH and drug screen negative - This  patients CHA2DS2-VASc Score and unadjusted Ischemic Stroke Rate (% per year) is equal to 0.6 % stroke rate/year from a score of 1 (female) - we had a long discussion regarding rate vs rhythm control, anticoagulation, and options for TEE-guided DCCV vs 3 weeks of anticoagulation then DCCV - agree with eliquis 5 mg BID for now - she has eaten today - she is resistant to spending another night in the hospital, but is not rate controlled with activity - she will discuss options with Dr. Harrell Gave   Alcohol use - drinks 2 alcoholic beverages every evening   Risk factor modification Current smoker - encouraged cessation 03/23/2020: Cholesterol 165; HDL 32; LDL Cholesterol 108; Triglycerides 127; VLDL 25 - given family history, will start a low dose crestor - she already takes 81 mg ASA - A1c pending   Anxiety - per primary     For questions or updates, please contact [facility] Please consult [email or web address] for contact info under    Signed, Ledora Bottcher, PA  03/23/2020 12:40 PM

## 2020-03-23 NOTE — Discharge Instructions (Addendum)
You were hospitalized at [facility] due to irregular and fast heartrates.  We expect this is from atrial flutter which improved after starting medications to slow your heart rate.    Be sure to follow-up with cardiology.  Please also be sure to follow-up with our clinic to establish care.  Thank you for allowing [place] to take care of you.  Reminders and Additional recommendations:   1. Please quit smoking as this can cause high blood pressure and irregular heart rhythms.  2. We highly recommend stopping CBD supplementation because this can INCREASE your risk of bleeding! 3. We recommend stopping Aspirin. You are being started on Eliquis to prevent clots due to your irregular heart rhythm due to Atrial flutter / fibrillation.  4. Continue taking your Cardizem 120 mg capsules daily to help with your heart rate.    5. See follow up provider list for additional details regarding follow up appointments    Take care, [facility] team      Information on my medicine - ELIQUIS (apixaban)  Why was Eliquis prescribed for you? Eliquis was prescribed for you to reduce the risk of a blood clot forming that can cause a stroke if you have a medical condition called atrial fibrillation (a type of irregular heartbeat).  What do You need to know about Eliquis ? Take your Eliquis TWICE DAILY - one tablet in the morning and one tablet in the evening with or without food. If you have difficulty swallowing the tablet whole please discuss with your pharmacist how to take the medication safely.  Take Eliquis exactly as prescribed by your doctor and DO NOT stop taking Eliquis without talking to the doctor who prescribed the medication.  Stopping may increase your risk of developing a stroke.  Refill your prescription before you run out.  After discharge, you should have regular check-up appointments with your healthcare provider that is prescribing your Eliquis.  In the future your dose may  need to be changed if your kidney function or weight changes by a significant amount or as you get older.  What do you do if you miss a dose? If you miss a dose, take it as soon as you remember on the same day and resume taking twice daily.  Do not take more than one dose of ELIQUIS at the same time to make up a missed dose.  Important Safety Information A possible side effect of Eliquis is bleeding. You should call your healthcare provider right away if you experience any of the following: ? Bleeding from an injury or your nose that does not stop. ? Unusual colored urine (red or dark brown) or unusual colored stools (red or black). ? Unusual bruising for unknown reasons. ? A serious fall or if you hit your head (even if there is no bleeding).  Some medicines may interact with Eliquis and might increase your risk of bleeding or clotting while on Eliquis. To help avoid this, consult your healthcare provider or pharmacist prior to using any new prescription or non-prescription medications, including herbals, vitamins, non-steroidal anti-inflammatory drugs (NSAIDs) and supplements.  This website has more information on Eliquis (apixaban): http://www.eliquis.com/eliquis/home

## 2020-03-23 NOTE — Care Management (Signed)
Eligibility sent for Eliquis

## 2020-03-24 LAB — HEMOGLOBIN A1C
Hgb A1c MFr Bld: 5.5 % (ref 4.8–5.6)
Mean Plasma Glucose: 111 mg/dL

## 2020-03-30 ENCOUNTER — Encounter (HOSPITAL_COMMUNITY): Payer: Self-pay | Admitting: Nurse Practitioner

## 2020-03-30 ENCOUNTER — Ambulatory Visit (HOSPITAL_COMMUNITY)
Admit: 2020-03-30 | Discharge: 2020-03-30 | Disposition: A | Discharge status: Home / Self Care | Payer: Managed Care, Other (non HMO) | Source: Ambulatory Visit | Attending: Nurse Practitioner | Admitting: Nurse Practitioner

## 2020-03-30 ENCOUNTER — Telehealth: Payer: Self-pay | Admitting: *Deleted

## 2020-03-30 ENCOUNTER — Other Ambulatory Visit: Payer: Self-pay

## 2020-03-30 VITALS — BP 132/66 | HR 69 | Ht 64.0 in | Wt 193.0 lb

## 2020-03-30 DIAGNOSIS — F17210 Nicotine dependence, cigarettes, uncomplicated: Secondary | ICD-10-CM | POA: Insufficient documentation

## 2020-03-30 DIAGNOSIS — Z79899 Other long term (current) drug therapy: Secondary | ICD-10-CM | POA: Diagnosis not present

## 2020-03-30 DIAGNOSIS — I4891 Unspecified atrial fibrillation: Secondary | ICD-10-CM

## 2020-03-30 DIAGNOSIS — F419 Anxiety disorder, unspecified: Secondary | ICD-10-CM | POA: Insufficient documentation

## 2020-03-30 DIAGNOSIS — Z7901 Long term (current) use of anticoagulants: Secondary | ICD-10-CM | POA: Diagnosis not present

## 2020-03-30 NOTE — Progress Notes (Signed)
Primary Care Physician: Matilde Haymaker, MD Referring Physician: Dr. Buford Dresser    Jane Sparks is a 54 y.o. female with a h/o new onset afib that pt was notified by her apple watch that she was in afib. She was treated at [facility] 6/16 thru 6/17. She  was started on daily Cardizem and eliquis for a CHA2DS2VASc of 1. She was d/c in afib but  presents today in SR. She  is very anxious about being here.Discussed triggers for afib. She denies snoring, 2 cups of coffee a day and drinks at least 2 drinks of alcohol a night. When mentioned alcohol can be a trigger, she said that she was not cutting back on her alcohol. She states that she is being compliant with cardizem and eliqius. She has had covid vaccines.  Today, she denies symptoms of palpitations, chest pain, shortness of breath, orthopnea, PND, lower extremity edema, dizziness, presyncope, syncope, or neurologic sequela. The patient is tolerating medications without difficulties and is otherwise without complaint today.   Past Medical History:  Diagnosis Date  . Alcohol use   . Anxiety   . Atrial fibrillation with RVR ([place])   . Smoker    No past surgical history on file.  Current Outpatient Medications  Medication Sig Dispense Refill  . apixaban (ELIQUIS) 5 MG TABS tablet Take 1 tablet (5 mg total) by mouth 2 (two) times daily. 60 tablet 0  . calcium carbonate (TUMS - DOSED IN MG ELEMENTAL CALCIUM) 500 MG chewable tablet Chew 1 tablet by mouth daily as needed for indigestion or heartburn.    . cetirizine (ZYRTEC) 10 MG tablet Take 10 mg by mouth daily.    . cholecalciferol (VITAMIN D3) 25 MCG (1000 UNIT) tablet Take 1,000 Units by mouth daily.    Marland Kitchen diltiazem (CARDIZEM CD) 120 MG 24 hr capsule Take 1 capsule (120 mg total) by mouth daily. 30 capsule 3  . diphenhydrAMINE (BENADRYL) 25 MG tablet Take 25 mg by mouth as needed for allergies.     . famotidine (PEPCID) 20 MG tablet Take 20 mg by mouth as needed for heartburn or  indigestion.      No current facility-administered medications for this encounter.    Allergies  Allergen Reactions  . Penicillins   . Sulfa Antibiotics     Social History   Socioeconomic History  . Marital status: Married    Spouse name: Not on file  . Number of children: Not on file  . Years of education: Not on file  . Highest education level: Not on file  Occupational History  . Not on file  Tobacco Use  . Smoking status: Current Every Day Smoker  . Smokeless tobacco: Never Used  Substance and Sexual Activity  . Alcohol use: Yes  . Drug use: Not Currently  . Sexual activity: Not on file  Other Topics Concern  . Not on file  Social History Narrative  . Not on file   Social Determinants of Health   Financial Resource Strain:   . Difficulty of Paying Living Expenses:   Food Insecurity:   . Worried About Charity fundraiser in the Last Year:   . Arboriculturist in the Last Year:   Transportation Needs:   . Film/video editor (Medical):   Marland Kitchen Lack of Transportation (Non-Medical):   Physical Activity:   . Days of Exercise per Week:   . Minutes of Exercise per Session:   Stress:   . Feeling of Stress :  Social Connections:   . Frequency of Communication with Friends and Family:   . Frequency of Social Gatherings with Friends and Family:   . Attends Religious Services:   . Active Member of Clubs or Organizations:   . Attends Banker Meetings:   Marland Kitchen Marital Status:   Intimate Partner Violence:   . Fear of Current or Ex-Partner:   . Emotionally Abused:   Marland Kitchen Physically Abused:   . Sexually Abused:     Family History  Problem Relation Age of Onset  . Atrial fibrillation Mother   . CVA Mother   . Heart disease Father     ROS- All systems are reviewed and negative except as per the HPI above  Physical Exam: Vitals:   03/30/20 1348  BP: 132/66  Pulse: 69  Weight: 87.5 kg  Height: 5\' 4"  (1.626 m)   Wt Readings from Last 3 Encounters:    03/30/20 87.5 kg  03/23/20 86.6 kg    Labs: Lab Results  Component Value Date   NA 138 03/23/2020   K 3.9 03/23/2020   CL 108 03/23/2020   CO2 19 (L) 03/23/2020   GLUCOSE 93 03/23/2020   BUN 11 03/23/2020   CREATININE 0.84 03/23/2020   CALCIUM 9.0 03/23/2020   MG 1.8 03/23/2020   No results found for: INR Lab Results  Component Value Date   CHOL 165 03/23/2020   HDL 32 (L) 03/23/2020   LDLCALC 108 (H) 03/23/2020   TRIG 127 03/23/2020     GEN- The patient is well appearing, alert and oriented x 3 today.   Head- normocephalic, atraumatic Eyes-  Sclera clear, conjunctiva pink Ears- hearing intact Oropharynx- clear Neck- supple, no JVP Lymph- no cervical lymphadenopathy Lungs- Clear to ausculation bilaterally, normal work of breathing Heart- Regular rate and rhythm, no murmurs, rubs or gallops, PMI not laterally displaced GI- soft, NT, ND, + BS Extremities- no clubbing, cyanosis, or edema MS- no significant deformity or atrophy Skin- no rash or lesion Psych- euthymic mood, full affect Neuro- strength and sensation are intact  EKG-NSR at 69 bpm, pr int 164 ms, qrs int 64 ms, qtc 413 ms IMPRESSIONS    1. Inferior basal hypokinesis . Left ventricular ejection fraction, by  estimation, is 55%. The left ventricle has normal function. The left  ventricle demonstrates regional wall motion abnormalities (see scoring  diagram/findings for description). Left  ventricular diastolic parameters are indeterminate.  2. Right ventricular systolic function is normal. The right ventricular  size is normal. There is normal pulmonary artery systolic pressure.  3. Left atrial size was moderately dilated.  4. The mitral valve is normal in structure. Mild mitral valve  regurgitation. No evidence of mitral stenosis.  5. The aortic valve is tricuspid. Aortic valve regurgitation is trivial.  Mild aortic valve sclerosis is present, with no evidence of aortic valve  stenosis.  6.  The inferior vena cava is normal in size with greater than 50%  respiratory variability, suggesting right atrial pressure of 3 mmHg.    Assessment and Plan: 1. New onset afib Now back in SR Continue  Cardizem 120 mg daily General education re afib  Triggers discussed  Pt states that she does not plan to cut back on alcohol   2. CHA2DS2VASc score of 1 Can finish her current 30 day supply of eliquis and stop   3. Anxiety Pt was very uncomfortable being in clinic today   F/u with Dr. 03/25/2020 in 3-4 weeks    Cristal Deer  Eulah Pont, [facility] [address] [place], [place] [address] [phone]

## 2020-03-30 NOTE — Telephone Encounter (Signed)
A detailed message was left, re: follow up with Dr.Christopher.

## 2020-03-31 ENCOUNTER — Encounter: Payer: Self-pay | Admitting: Family Medicine

## 2020-03-31 ENCOUNTER — Inpatient Hospital Stay: Payer: Managed Care, Other (non HMO) | Admitting: Family Medicine

## 2020-03-31 ENCOUNTER — Other Ambulatory Visit: Payer: Self-pay

## 2020-03-31 ENCOUNTER — Ambulatory Visit (INDEPENDENT_AMBULATORY_CARE_PROVIDER_SITE_OTHER): Payer: Managed Care, Other (non HMO) | Admitting: Family Medicine

## 2020-03-31 VITALS — BP 112/68 | HR 65 | Ht 64.0 in | Wt 193.0 lb

## 2020-03-31 DIAGNOSIS — F419 Anxiety disorder, unspecified: Secondary | ICD-10-CM

## 2020-03-31 DIAGNOSIS — J3089 Other allergic rhinitis: Secondary | ICD-10-CM | POA: Diagnosis not present

## 2020-03-31 DIAGNOSIS — Z7289 Other problems related to lifestyle: Secondary | ICD-10-CM

## 2020-03-31 DIAGNOSIS — Z789 Other specified health status: Secondary | ICD-10-CM

## 2020-03-31 DIAGNOSIS — Z1159 Encounter for screening for other viral diseases: Secondary | ICD-10-CM

## 2020-03-31 DIAGNOSIS — D751 Secondary polycythemia: Secondary | ICD-10-CM

## 2020-03-31 DIAGNOSIS — F1090 Alcohol use, unspecified, uncomplicated: Secondary | ICD-10-CM

## 2020-03-31 DIAGNOSIS — Z720 Tobacco use: Secondary | ICD-10-CM

## 2020-03-31 DIAGNOSIS — I4892 Unspecified atrial flutter: Secondary | ICD-10-CM

## 2020-03-31 MED ORDER — BUPROPION HCL ER (XL) 150 MG PO TB24: 150.0000 mg | ORAL_TABLET | Freq: Every day | ORAL | 0 refills | Status: DC

## 2020-03-31 NOTE — Patient Instructions (Signed)
Thank you for coming to see me today. It was a pleasure to meet you.   Please follow up with cardiology as scheduled.  Please follow-up sooner if you develop any worsening symptoms or any signs of bleeding.  I have started you on a medication called Wellbutrin (generic name called bupropion).  You will take this once a day, every day.  This should help with both your anxiety and smoking cessation.  I recommend that you take this first thing in the morning as it may affect her sleep if taken too late in the day.  If you have any concerns about this medication please do not hesitate to call me and I will be glad to further discuss.  Please schedule a annual new patient exam at your earliest convenience to allow an opportunity to get to know you more in your past medical history.  We are checking some labs today, I will call you if they are abnormal will send you a MyChart message or a letter if they are normal.  If you do not hear about your labs in the next 2 weeks please let us know.  If you have any questions or concerns, please do not hesitate to call the office at [phone].  Take Care,  Dr. Orpah Cobb, DO Resident Physician [facility] [phone]

## 2020-03-31 NOTE — Progress Notes (Addendum)
SUBJECTIVE:   CHIEF COMPLAINT / HPI:   Jane Sparks is a 54 y.o. female with a history of atrial flutter/fib, alcohol/tobacco use, anxiety, and elevated LDL who presents for hospital follow up.   Hospital Follow up: Patient was hospitalized from 6/16-6/17 for new onset afib that was found by her apple watch. She believes that this episode was caused by a very stressful argument with her mother. She was started on Cardizem 120mg  QD and Eliquis for CHADSVASC score of 1. She endorses compliance. She declined TEE while hospitalized. She followed up with cardiology on 6/24 and was noted to be in sinus rhythm. They plan to discontinue the eliquis after 30 days. She has follow up with Dr. 7/24 in 3-4 weeks to further discuss cardioversion. Denies chest pain, SOB, palpitations, lightheadedness/dizziness today. She uses her apple watch consistently to check that she is in sinus rhythm.   ECHO with EF 55% and inferior basal hypokinesis.  In the setting of atrial flutter, the inferior basal area is poorly visualized.  Plan to repeat ECHO outpatient. TSH: WNL, UDS negative, Ethanol Negative   Anxiety: GAD-7 score of 7. PHQ-9 score of 4. Patient began crying when asking about anxiety and endorses her mother is her greatest trigger for her anxiety leading to anger. She is the only one who works and takes care of her mother, and this pressure compounds the anxiety. When not around her mother she feels better. She denies therapy at this time. Denies SI/HI. She is reluctant to start a daily medication but would start something until she can get back on her CBD, which she is off of due to taking eliquis.   Polycythemia: Elevated RBC of 5.37 on discharge. Patient does endorse snoring nightly. Concern for OSA. Not open to seeing sleep specialist due to wearing mask during the night and being touched. Counseled on untreated OSA effects on the body and possible exacerbation of a fib.  Allergies: Patient  states she has tried every OTC medication available for her allergies with no meaningful relief. She agrees to see an allergist to explore further options.    Health Maintenance: Due for Hep C screening, TDAP, pap-smear, mammogram, and colonoscopy. Patient is okay with Hep C screening. She denies other health maintenance procedures because she does "not like to be touched;" very guarded at mentioning of the screenings. Counseled on importance.  Patient has stopped smoking since she left the hospital. She says she is not using the gum or patches and is getting along okay. Patient continues to use alcohol as one of her "coping mechanisms."  Review of Systems: per HPI.  PERTINENT  PMH / PSH: a fib, anxiety, alcohol use, tobacco use. Moved from [place] and may not be used to the pollen levels in [place].  OBJECTIVE:   BP 112/68    Pulse 65    Ht 5\' 4"  (1.626 m)    Wt 193 lb (87.5 kg)    SpO2 98%    BMI 33.13 kg/m   General: well nourished, well developed, anxious appearing and emotional Resp: breathing comfortably on room air, speaking in full sentences Neuro: normal gait   Depression screen PHQ 2/9 03/31/2020  Decreased Interest 1  Down, Depressed, Hopeless 1  PHQ - 2 Score 2  Altered sleeping 1  Tired, decreased energy 1  Change in appetite 0  Feeling bad or failure about yourself  0  Trouble concentrating 0  Moving slowly or fidgety/restless 0  Suicidal thoughts 0  PHQ-9  Score 4  Difficult doing work/chores Not difficult at all   GAD 7 : Generalized Anxiety Score 03/31/2020  Nervous, Anxious, on Edge 1  Control/stop worrying 1  Worry too much - different things 1  Trouble relaxing 1  Restless 1  Easily annoyed or irritable 1  Afraid - awful might happen 1  Total GAD 7 Score 7  Anxiety Difficulty Not difficult at all    ASSESSMENT/PLAN:   Atrial flutter ([place]) Continue use of eliquis and follow up with cardiology as scheduled. Follow up with becomes symptomatic.   Anxiety Start  wellbutrin 150 mg once per day for 30 days for anxiety and aid in smoking cessation. Follow up in one month to assess improvement.  Alcohol use Alcohol cessation counseling provided.   Tobacco abuse Patient reports stopping tobacco use since leaving hospital.  - Begin Wellbutrin 150mg  QD for smoking cessation   Environmental and seasonal allergies Referred to allergist to explore further options.   Polycythemia: Unclear etiology. Concern for OSA given endorsement of snoring. Declined sleep study.  CBC ordered to assess polycythemia.  Health Maintenance: Hep C obtained. Denied colonoscopy, pap smear, and mammogram at this time. Patient to schedule yearly appointment with PCP at earliest convenience.  [place]  Attestation: I was personally present and performed or re-performed the history, physical exam, and medical decision making activities of this service and have verified that the service and findings are accurately documented in the student's note.   The following is my additional documentation.  Physical Exam: Gen: well appearing older female, sitting comfortably in exam chair Resp: breathing comfortably on room air, speaking in full sentences MSK: normal gait Skin: warm and dry  Assessment/Plan Jane Sparks is a 54 y.o. female presenting for hospital follow up after realizing new onset atrial fibrillation on I-watch. She is following closely with cardiology and continues to be asymptomatic.  She endorses significant anxiety regarding caring for her mother. She is reluctant to start medications but is open to a trial of Wellbutrin in hopes it will help with her anxiety and smoking cessation. PHQ-9 and GAD-7 are not significantly elevated which is reassuring. She denies SI/HI. She prefers a female provider, thus recommended she request change to me (Dr. Tarry Kos) or alternative provider, whichever she prefers. She voiced  understanding and agreement with plan. Discussed reasons to return to care sooner such as worsening SOB, chest pain, palpitations, vision changes, etc and she voiced understanding and agreement. She plans to follow up with cardiology as planned. Recommended scheduling follow up/new patient exam at earliest convenience.   Mina Marble, DO 04/01/2020 6:30 PM

## 2020-03-31 NOTE — Progress Notes (Deleted)
   Subjective:   Patient ID: Jane Sparks    DOB: 1966-07-29, 54 y.o. female   MRN: [id number]  Jane Sparks is a 54 y.o. female with a history of atrial flutter/fib, alcohol/tobacco use, anxiety, elevated LDL  here for Hospital Follow up   Hospital Follow up: Patient was hospitalized from 6/16-6/17 for new onset afib that was found by her apple watch. She was started on Cardizem 120mg  QD and Eliquis for CHADSVASC score of 1. She endorses compliance. She declined TEE while hospitalized. She followed up with cardiology on 6/24 and was noted to be in sinus rhythm. They plan to discontinue the eliquis after 30 days. She has follow up with Dr. 7/24 in 3-4 weeks to further discuss cardioversion. Denies chest pain, SOB, palpitations, lightheadedness/dizziness.   ECHO with EF 55% and inferior basal hypokinesis.  In the setting of atrial flutter, the inferior basal area is poorly visualized.  Plan to repeat ECHO outpatient. TSH: WNL, UDS negative, Ethanol Negative  Anxiety: GAD-7 score of 7. PHQ-9 score of 4. Patient endores her mother is her greatest trigger for her anxiety leading to anger. When not around her mother she feels better. She is not interested in starting medications or therapy at this time. Denies SI/HI Open to starting medications?  Polycythemia: Elevated RBC of 5.37 on discharge. Patient does endorse snoring nightly. Concern for OSA. Snorting??  Health Maintenance: Due for Hep C screening, TDAP, pap-smear, mammogram, and colonoscopy  Review of Systems:  Per HPI.   Objective:   There were no vitals taken for this visit. Vitals and nursing note reviewed.  General: well nourished, well developed, in no acute distress with non-toxic appearance HEENT: normocephalic, atraumatic, moist mucous membranes Neck: supple, non-tender without lymphadenopathy CV: regular rate and rhythm without murmurs, rubs, or gallops, no lower extremity edema Lungs: clear to auscultation  bilaterally with normal work of breathing Abdomen: soft, non-tender, non-distended, no masses or organomegaly palpable, normoactive bowel sounds Skin: warm, dry, no rashes or lesions Extremities: warm and well perfused, normal tone MSK: ROM grossly intact, strength intact, gait normal Neuro: Alert and oriented, speech normal  Assessment & Plan:   No problem-specific Assessment & Plan notes found for this encounter.  No orders of the defined types were placed in this encounter.  No orders of the defined types were placed in this encounter.   Hyperlipidemia: The 10-year ASCVD risk score Cristal Deer DC Jr., et al., 2013) is: 6.6%. Recommended lifestyle modifications.  Tobacco use: Smoked *** pack/day x *** years (*** pack year history). Does ***not qualify for lung cancer screen at this time. Patient was counseled on the risks of tobacco use and cessation strongly encouraged.  Recommendations; Annual lung cancer screening with low-dose CT to adults aged 48 to 80 years, with a 20 pack-year smoking history, and who currently smoke or have quit within the last 15 years (B recommendation). Screening should be discontinued once a person has not smoked for 15 years, develops a substantially limited life-expectancy, or no longer desires to continue screening.  Health Maintenance: Hep C obtained Patient to schedule yearly appointment with PCP at earliest convenience  44, DO PGY-2, [facility] 03/31/2020 7:51 AM

## 2020-04-01 DIAGNOSIS — J3089 Other allergic rhinitis: Secondary | ICD-10-CM | POA: Insufficient documentation

## 2020-04-01 LAB — CBC
Hematocrit: 43.3 % (ref 34.0–46.6)
Hemoglobin: 14.4 g/dL (ref 11.1–15.9)
MCH: 29.3 pg (ref 26.6–33.0)
MCHC: 33.3 g/dL (ref 31.5–35.7)
MCV: 88 fL (ref 79–97)
Platelets: 342 10*3/uL (ref 150–450)
RBC: 4.92 x10E6/uL (ref 3.77–5.28)
RDW: 13.2 % (ref 11.7–15.4)
WBC: 5.6 10*3/uL (ref 3.4–10.8)

## 2020-04-01 LAB — HEPATITIS C ANTIBODY: Hep C Virus Ab: <0.1 s/co ratio (ref 0.0–0.9)

## 2020-04-01 NOTE — Assessment & Plan Note (Signed)
Alcohol cessation counseling provided.

## 2020-04-01 NOTE — Assessment & Plan Note (Signed)
Referred to allergist to explore further options.

## 2020-04-01 NOTE — Assessment & Plan Note (Signed)
Continue use of eliquis and follow up with cardiology as scheduled. Follow up with becomes symptomatic.

## 2020-04-01 NOTE — Assessment & Plan Note (Signed)
Start wellbutrin 150 mg once per day for 30 days for anxiety and aid in smoking cessation. Follow up in one month to assess improvement.

## 2020-04-01 NOTE — Assessment & Plan Note (Signed)
Patient reports stopping tobacco use since leaving hospital.  - Begin Wellbutrin 150mg  QD for smoking cessation

## 2020-04-03 ENCOUNTER — Encounter: Payer: Self-pay | Admitting: Family Medicine

## 2020-04-03 ENCOUNTER — Encounter (HOSPITAL_COMMUNITY): Payer: Self-pay | Admitting: Nurse Practitioner

## 2020-04-03 MED ORDER — BUPROPION HCL 75 MG PO TABS: 75.0000 mg | ORAL_TABLET | Freq: Every day | ORAL | 0 refills | Status: DC

## 2020-04-03 NOTE — Addendum Note (Signed)
Encounter addended by: Newman Nip, NP on: 04/03/2020 8:30 AM  Actions taken: Medication List reviewed, Problem List reviewed, Allergies reviewed, Level of Service modified

## 2020-04-22 ENCOUNTER — Other Ambulatory Visit: Payer: Self-pay | Admitting: Family Medicine

## 2020-04-23 ENCOUNTER — Encounter: Payer: Self-pay | Admitting: Family Medicine

## 2020-04-24 ENCOUNTER — Other Ambulatory Visit: Payer: Self-pay | Admitting: Family Medicine

## 2020-04-24 MED ORDER — DILTIAZEM HCL ER COATED BEADS 120 MG PO CP24: 120.0000 mg | ORAL_CAPSULE | Freq: Every day | ORAL | 3 refills | Status: DC

## 2020-04-24 MED ORDER — APIXABAN 5 MG PO TABS: 5.0000 mg | ORAL_TABLET | Freq: Two times a day (BID) | ORAL | 2 refills | Status: DC

## 2020-04-26 ENCOUNTER — Telehealth: Payer: Self-pay

## 2020-04-26 ENCOUNTER — Other Ambulatory Visit: Payer: Self-pay | Admitting: Family Medicine

## 2020-04-26 NOTE — Telephone Encounter (Signed)
Received fax from pharmacy, PA needed on Eliquis 5mg . Clinical questions submitted via Cover My Meds. Medication approved 04/26/2020-04/26/2021. Pharmacy has been updated.   Cover My Meds info: Key: [id number]

## 2020-04-27 ENCOUNTER — Ambulatory Visit: Payer: Managed Care, Other (non HMO) | Admitting: Allergy

## 2020-05-22 ENCOUNTER — Ambulatory Visit: Payer: Managed Care, Other (non HMO) | Admitting: Cardiology

## 2020-05-22 ENCOUNTER — Encounter: Payer: Self-pay | Admitting: Cardiology

## 2020-05-22 ENCOUNTER — Other Ambulatory Visit: Payer: Self-pay

## 2020-05-22 VITALS — BP 140/86 | HR 69 | Temp 97.2°F | Ht 64.0 in | Wt 194.0 lb

## 2020-05-22 DIAGNOSIS — Z7289 Other problems related to lifestyle: Secondary | ICD-10-CM

## 2020-05-22 DIAGNOSIS — Z716 Tobacco abuse counseling: Secondary | ICD-10-CM

## 2020-05-22 DIAGNOSIS — I4892 Unspecified atrial flutter: Secondary | ICD-10-CM

## 2020-05-22 DIAGNOSIS — Z720 Tobacco use: Secondary | ICD-10-CM

## 2020-05-22 DIAGNOSIS — Z7189 Other specified counseling: Secondary | ICD-10-CM

## 2020-05-22 DIAGNOSIS — Z789 Other specified health status: Secondary | ICD-10-CM

## 2020-05-22 DIAGNOSIS — I4891 Unspecified atrial fibrillation: Secondary | ICD-10-CM

## 2020-05-22 DIAGNOSIS — F1090 Alcohol use, unspecified, uncomplicated: Secondary | ICD-10-CM

## 2020-05-22 NOTE — Patient Instructions (Signed)
Medication Instructions:  Stop Eliquis 5 mg   *If you need a refill on your cardiac medications before your next appointment, please call your pharmacy*   Lab Work: None  Testing/Procedures: None   Follow-Up: At [facility], you and your health needs are our priority.  As part of our continuing mission to provide you with exceptional heart care, we have created designated Provider Care Teams.  These Care Teams include your primary Cardiologist (physician) and Advanced Practice Providers (APPs -  Physician Assistants and Nurse Practitioners) who all work together to provide you with the care you need, when you need it.  We recommend signing up for the patient portal called "MyChart".  Sign up information is provided on this After Visit Summary.  MyChart is used to connect with patients for Virtual Visits (Telemedicine).  Patients are able to view lab/test results, encounter notes, upcoming appointments, etc.  Non-urgent messages can be sent to your provider as well.   To learn more about what you can do with MyChart, go to [email or web address].    Your next appointment:   1 year(s)  The format for your next appointment:   In Person  Provider:   Jodelle Red, MD

## 2020-05-22 NOTE — Progress Notes (Signed)
Cardiology Office Note:    Date:  05/22/2020   ID:  Jane Sparks, DOB November 15, 1965, MRN [id number]  PCP:  Jane Haymaker, MD  Cardiologist:  Buford Dresser, MD  Referring MD: Jane Haymaker, MD   No chief complaint on file.   History of Present Illness:    Jane Sparks is a 54 y.o. female with a hx of anxiety, tobacco use, alcohol use, atrial flutter who is seen for follow up today. I initially met her in the hospital 03/23/20 when she had new onset atrial flutter.  Today: Overall doing well, once every 1-2 weeks has brief lightheadedness. No palpitations. We reviewed her hospitalization, recommended management of flutter. She is in sinus rhythm and feels well today. Tolerating medications. We again reviewed that tobacco and especially alcohol can be triggers for atrial arrhythmias.  Denies chest pain, shortness of breath at rest or with normal exertion. No PND, orthopnea, LE edema or unexpected weight gain. No syncope or palpitations.  Past Medical History:  Diagnosis Date  . Alcohol use   . Anxiety   . Atrial fibrillation with RVR ([place])   . Smoker     No past surgical history on file.  Current Medications: Current Outpatient Medications on File Prior to Visit  Medication Sig  . apixaban (ELIQUIS) 5 MG TABS tablet Take 1 tablet (5 mg total) by mouth 2 (two) times daily.  Marland Kitchen buPROPion (WELLBUTRIN) 75 MG tablet TAKE 1 TABLET BY MOUTH EVERY DAY  . calcium carbonate (TUMS - DOSED IN MG ELEMENTAL CALCIUM) 500 MG chewable tablet Chew 1 tablet by mouth daily as needed for indigestion or heartburn.  . cetirizine (ZYRTEC) 10 MG tablet Take 10 mg by mouth daily.  . cholecalciferol (VITAMIN D3) 25 MCG (1000 UNIT) tablet Take 1,000 Units by mouth daily.  Marland Kitchen diltiazem (CARDIZEM CD) 120 MG 24 hr capsule Take 1 capsule (120 mg total) by mouth daily.  . diphenhydrAMINE (BENADRYL) 25 MG tablet Take 25 mg by mouth as needed for allergies.   . famotidine (PEPCID) 20 MG tablet Take 20 mg by  mouth as needed for heartburn or indigestion.    No current facility-administered medications on file prior to visit.     Allergies:   Penicillins and Sulfa antibiotics   Social History   Tobacco Use  . Smoking status: Former Research scientist (life sciences)  . Smokeless tobacco: Never Used  . Tobacco comment: stopped after hospital stay 03/24/2020  Substance Use Topics  . Alcohol use: Yes  . Drug use: Not Currently    Family History: family history includes Atrial fibrillation in her mother; CVA in her mother; Heart disease in her father.  ROS:   Please see the history of present illness.  Additional pertinent ROS otherwise unremarkable.  EKGs/Labs/Other Studies Reviewed:    The following studies were reviewed today: Echo 03/23/20 1. Inferior basal hypokinesis . Left ventricular ejection fraction, by  estimation, is 55%. The left ventricle has normal function. The left  ventricle demonstrates regional wall motion abnormalities (see scoring  diagram/findings for description). Left  ventricular diastolic parameters are indeterminate.  2. Right ventricular systolic function is normal. The right ventricular  size is normal. There is normal pulmonary artery systolic pressure.  3. Left atrial size was moderately dilated.  4. The mitral valve is normal in structure. Mild mitral valve  regurgitation. No evidence of mitral stenosis.  5. The aortic valve is tricuspid. Aortic valve regurgitation is trivial.  Mild aortic valve sclerosis is present, with no evidence of aortic valve  stenosis.  6. The inferior vena cava is normal in size with greater than 50%  respiratory variability, suggesting right atrial pressure of 3 mmHg.  EKG:  EKG is personally reviewed.  The ekg ordered today demonstrates sinus rhythm at 67 bpm.  Recent Labs: 03/23/2020: BUN 11; Creatinine, Ser 0.84; Magnesium 1.8; Potassium 3.9; Sodium 138; TSH 3.062 03/31/2020: Hemoglobin 14.4; Platelets 342  Recent Lipid Panel    Component  Value Date/Time   CHOL 165 03/23/2020 0411   TRIG 127 03/23/2020 0411   HDL 32 (L) 03/23/2020 0411   CHOLHDL 5.2 03/23/2020 0411   VLDL 25 03/23/2020 0411   LDLCALC 108 (H) 03/23/2020 0411    Physical Exam:    VS:  BP 140/86   Pulse 69   Temp (!) 97.2 F (36.2 C)   Ht '5\' 4"'  (1.626 m)   Wt 194 lb (88 kg)   SpO2 94%   BMI 33.30 kg/m     Wt Readings from Last 3 Encounters:  05/22/20 194 lb (88 kg)  03/31/20 193 lb (87.5 kg)  03/30/20 193 lb (87.5 kg)    GEN: Well nourished, well developed in no acute distress HEENT: Normal, moist mucous membranes NECK: No JVD CARDIAC: regular rhythm, normal S1 and S2, no rubs or gallops. No murmurs. VASCULAR: Radial and DP pulses 2+ bilaterally. No carotid bruits RESPIRATORY:  Clear to auscultation without rales, wheezing or rhonchi  ABDOMEN: Soft, non-tender, non-distended MUSCULOSKELETAL:  Ambulates independently SKIN: Warm and dry, no edema NEUROLOGIC:  Alert and oriented x 3. No focal neuro deficits noted. PSYCHIATRIC:  Normal affect    ASSESSMENT:    1. Paroxysmal atrial flutter ([place])   2. Tobacco abuse   3. Tobacco abuse counseling   4. Alcohol use   5. Cardiac risk counseling   6. Counseling on health promotion and disease prevention    PLAN:    Paroxysmal atrial flutter -CHA2DS2/VAS Stroke Risk Points= 1, placed on apixaban given prior plan for cardioversion -she has been in sinus rhythm on 2 appts. After shared decision making, will stop apixaban -tolerating diltiazem, discussed options, will continue for now -we spoke at length about tobacco and alcohol use.  -discussed red flag warning signs, when to call the office and when to go to ER  Tobacco use counseling: The patient was counseled on tobacco cessation today for 4 minutes.  Counseling included reviewing the risks of smoking tobacco products, how it impacts the patient's current medical diagnoses and different strategies for quitting.  Pharmacotherapy to aid in  tobacco cessation was not prescribed today.  Cardiac risk counseling and prevention recommendations: -recommend heart healthy/Mediterranean diet, with whole grains, fruits, vegetable, fish, lean meats, nuts, and olive oil. Limit salt. -recommend moderate walking, 3-5 times/week for 30-50 minutes each session. Aim for at least 150 minutes.week. Goal should be pace of 3 miles/hours, or walking 1.5 miles in 30 minutes -recommend avoidance of tobacco products. Avoid excess alcohol.  -ASCVD risk score: The 10-year ASCVD risk score Mikey Bussing DC Brooke Bonito., et al., 2013) is: 3%   Values used to calculate the score:     Age: 89 years     Sex: Female     Is Non-Hispanic African American: No     Diabetic: No     Tobacco smoker: No     Systolic Blood Pressure: 283 mmHg     Is BP treated: No     HDL Cholesterol: 32 mg/dL     Total Cholesterol: 165 mg/dL    Plan  for follow up: 1 year or sooner as needed  Buford Dresser, MD, PhD [place]  [facility]    Medication Adjustments/Labs and Tests Ordered: Current medicines are reviewed at length with the patient today.  Concerns regarding medicines are outlined above.  Orders Placed This Encounter  Procedures  . EKG 12-Lead   No orders of the defined types were placed in this encounter.   Patient Instructions  Medication Instructions:  Stop Eliquis 5 mg   *If you need a refill on your cardiac medications before your next appointment, please call your pharmacy*   Lab Work: None  Testing/Procedures: None   Follow-Up: At [facility], you and your health needs are our priority.  As part of our continuing mission to provide you with exceptional heart care, we have created designated Provider Care Teams.  These Care Teams include your primary Cardiologist (physician) and Advanced Practice Providers (APPs -  Physician Assistants and Nurse Practitioners) who all work together to provide you with the care you need, when you need it.  We  recommend signing up for the patient portal called "MyChart".  Sign up information is provided on this After Visit Summary.  MyChart is used to connect with patients for Virtual Visits (Telemedicine).  Patients are able to view lab/test results, encounter notes, upcoming appointments, etc.  Non-urgent messages can be sent to your provider as well.   To learn more about what you can do with MyChart, go to [email or web address].    Your next appointment:   1 year(s)  The format for your next appointment:   In Person  Provider:   Buford Dresser, MD      Signed, Buford Dresser, MD PhD 05/22/2020   [place]

## 2020-05-26 ENCOUNTER — Telehealth: Payer: Self-pay | Admitting: Cardiology

## 2020-05-26 NOTE — Telephone Encounter (Signed)
Patient is calling to follow up about her call from this morning. She states she has not received a call yet and is wondering if she just needs to continue with what she is doing until she hears back from her. Please call back.

## 2020-05-26 NOTE — Telephone Encounter (Signed)
New message:    Patient saw doctor this week and stated that if anything changes with her to call the office concerning some medication. Please call patient.

## 2020-05-26 NOTE — Telephone Encounter (Signed)
Patient called into office this morning regarding heart palpitations. Patient states that last night she started feeling fluttering in her chest and her apple watch said she had signs of Afib. Patient states she just saw Dr. Cristal Deer on Monday. Patient also stated she had stopped her eliquis on Monday but took it last night when her heart started fluttering and she was showing signs of afib. Patient states she feels fine, heart rate right now is 80, has not been able to check BP but states its probably elevated given her anxiety about the afib, she denies chest pain, dizziness or SOB. She states she is just anxious about the afib. She wanted to make Dr. Cristal Deer aware and wants clarification on whether she needs to be seen in the office and if she should continue taking eliquis. Advised patient that I would forward this message to Dr. Cristal Deer and her nurse.

## 2020-05-29 NOTE — Telephone Encounter (Signed)
MD provided recommendations to pt through mychart.

## 2020-05-30 NOTE — Telephone Encounter (Signed)
Can we get her in with the afib clinic? Thanks.

## 2020-05-31 NOTE — Telephone Encounter (Signed)
Patient returned call

## 2020-05-31 NOTE — Telephone Encounter (Signed)
Called and spoke with pt, she states she had missed a call from [place] today. She states she was just confused on who she could talk to regarding her questions. Notified that Dr.Christopher did send a mychart message and stated she could speak with the afib clinic or we could set up an appt for her to discuss. Pt states she doesn't need an appt, she just needs to have someone answer her questions. She reports that she will call the afib clinic first. Notified this was fine and if she had any other questions or issues to contact our office again. Pt verbalized understanding with no other questions at this time.

## 2020-06-07 DIAGNOSIS — I639 Cerebral infarction, unspecified: Secondary | ICD-10-CM

## 2020-06-07 HISTORY — DX: Cerebral infarction, unspecified: I63.9

## 2020-07-17 ENCOUNTER — Encounter: Payer: Self-pay | Admitting: Cardiology

## 2020-07-26 ENCOUNTER — Other Ambulatory Visit: Payer: Self-pay | Admitting: Family Medicine

## 2020-08-09 ENCOUNTER — Other Ambulatory Visit: Payer: Self-pay | Admitting: Family Medicine

## 2020-08-17 ENCOUNTER — Emergency Department (HOSPITAL_COMMUNITY): Payer: Managed Care, Other (non HMO)

## 2020-08-17 ENCOUNTER — Emergency Department (HOSPITAL_COMMUNITY)
Admission: EM | Admit: 2020-08-17 | Discharge: 2020-08-17 | Disposition: A | Discharge status: Home / Self Care | Payer: Managed Care, Other (non HMO) | Attending: Emergency Medicine | Admitting: Emergency Medicine

## 2020-08-17 ENCOUNTER — Other Ambulatory Visit: Payer: Self-pay

## 2020-08-17 ENCOUNTER — Encounter (HOSPITAL_COMMUNITY): Payer: Self-pay

## 2020-08-17 DIAGNOSIS — G459 Transient cerebral ischemic attack, unspecified: Secondary | ICD-10-CM | POA: Diagnosis not present

## 2020-08-17 DIAGNOSIS — G969 Disorder of central nervous system, unspecified: Secondary | ICD-10-CM | POA: Diagnosis present

## 2020-08-17 DIAGNOSIS — I480 Paroxysmal atrial fibrillation: Secondary | ICD-10-CM

## 2020-08-17 DIAGNOSIS — Z7901 Long term (current) use of anticoagulants: Secondary | ICD-10-CM | POA: Insufficient documentation

## 2020-08-17 DIAGNOSIS — I6310 Cerebral infarction due to embolism of unspecified precerebral artery: Secondary | ICD-10-CM

## 2020-08-17 DIAGNOSIS — Z87891 Personal history of nicotine dependence: Secondary | ICD-10-CM | POA: Insufficient documentation

## 2020-08-17 DIAGNOSIS — I6300 Cerebral infarction due to thrombosis of unspecified precerebral artery: Secondary | ICD-10-CM | POA: Diagnosis not present

## 2020-08-17 DIAGNOSIS — I63411 Cerebral infarction due to embolism of right middle cerebral artery: Secondary | ICD-10-CM

## 2020-08-17 LAB — I-STAT CHEM 8, ED
BUN: 15 mg/dL (ref 6–20)
Calcium, Ion: 1.1 mmol/L — ABNORMAL LOW (ref 1.15–1.40)
Chloride: 107 mmol/L (ref 98–111)
Creatinine, Ser: 0.8 mg/dL (ref 0.44–1.00)
Glucose, Bld: 93 mg/dL (ref 70–99)
HCT: 41 % (ref 36.0–46.0)
Hemoglobin: 13.9 g/dL (ref 12.0–15.0)
Potassium: 3.9 mmol/L (ref 3.5–5.1)
Sodium: 142 mmol/L (ref 135–145)
TCO2: 21 mmol/L — ABNORMAL LOW (ref 22–32)

## 2020-08-17 LAB — LIPID PANEL
Cholesterol: 148 mg/dL (ref 0–200)
HDL: 34 mg/dL — ABNORMAL LOW (ref >40)
LDL Cholesterol: 100 mg/dL — ABNORMAL HIGH (ref 0–99)
Total CHOL/HDL Ratio: 4.4 RATIO
Triglycerides: 68 mg/dL (ref <150)
VLDL: 14 mg/dL (ref 0–40)

## 2020-08-17 LAB — DIFFERENTIAL
Abs Immature Granulocytes: 0.04 10*3/uL (ref 0.00–0.07)
Basophils Absolute: 0 10*3/uL (ref 0.0–0.1)
Basophils Relative: 1 %
Eosinophils Absolute: 0.1 10*3/uL (ref 0.0–0.5)
Eosinophils Relative: 2 %
Immature Granulocytes: 1 %
Lymphocytes Relative: 29 %
Lymphs Abs: 1.9 10*3/uL (ref 0.7–4.0)
Monocytes Absolute: 0.6 10*3/uL (ref 0.1–1.0)
Monocytes Relative: 8 %
Neutro Abs: 3.9 10*3/uL (ref 1.7–7.7)
Neutrophils Relative %: 59 %

## 2020-08-17 LAB — CBC
HCT: 39.7 % (ref 36.0–46.0)
Hemoglobin: 13.1 g/dL (ref 12.0–15.0)
MCH: 28.7 pg (ref 26.0–34.0)
MCHC: 33 g/dL (ref 30.0–36.0)
MCV: 87.1 fL (ref 80.0–100.0)
Platelets: 326 10*3/uL (ref 150–400)
RBC: 4.56 MIL/uL (ref 3.87–5.11)
RDW: 13.6 % (ref 11.5–15.5)
WBC: 6.5 10*3/uL (ref 4.0–10.5)
nRBC: 0 % (ref 0.0–0.2)

## 2020-08-17 LAB — COMPREHENSIVE METABOLIC PANEL
ALT: 32 U/L (ref 0–44)
AST: 24 U/L (ref 15–41)
Albumin: 3.8 g/dL (ref 3.5–5.0)
Alkaline Phosphatase: 64 U/L (ref 38–126)
Anion gap: 10 (ref 5–15)
BUN: 13 mg/dL (ref 6–20)
CO2: 23 mmol/L (ref 22–32)
Calcium: 9.2 mg/dL (ref 8.9–10.3)
Chloride: 106 mmol/L (ref 98–111)
Creatinine, Ser: 0.91 mg/dL (ref 0.44–1.00)
GFR, Estimated: >60 mL/min (ref >60)
Glucose, Bld: 97 mg/dL (ref 70–99)
Potassium: 3.9 mmol/L (ref 3.5–5.1)
Sodium: 139 mmol/L (ref 135–145)
Total Bilirubin: 0.8 mg/dL (ref 0.3–1.2)
Total Protein: 7.6 g/dL (ref 6.5–8.1)

## 2020-08-17 LAB — I-STAT BETA HCG BLOOD, ED (MC, WL, AP ONLY): I-stat hCG, quantitative: <5 m[IU]/mL (ref <5)

## 2020-08-17 LAB — APTT: aPTT: 36 seconds (ref 24–36)

## 2020-08-17 LAB — HEMOGLOBIN A1C
Hgb A1c MFr Bld: 5.3 % (ref 4.8–5.6)
Mean Plasma Glucose: 105.41 mg/dL

## 2020-08-17 LAB — PROTIME-INR
INR: 1.5 — ABNORMAL HIGH (ref 0.8–1.2)
Prothrombin Time: 17.3 seconds — ABNORMAL HIGH (ref 11.4–15.2)

## 2020-08-17 MED ORDER — ATORVASTATIN CALCIUM 40 MG PO TABS: 40.0000 mg | ORAL_TABLET | Freq: Every day | ORAL | 1 refills | Status: DC

## 2020-08-17 MED ORDER — APIXABAN 5 MG PO TABS: 5.0000 mg | ORAL_TABLET | Freq: Two times a day (BID) | ORAL | 1 refills | Status: DC

## 2020-08-17 MED ORDER — ASPIRIN EC 81 MG PO TBEC: 81.0000 mg | DELAYED_RELEASE_TABLET | Freq: Every day | ORAL | Status: DC

## 2020-08-17 MED ORDER — SODIUM CHLORIDE 0.9% FLUSH: 3.0000 mL | Freq: Once | INTRAVENOUS | Status: DC

## 2020-08-17 MED ORDER — GADOBUTROL 1 MMOL/ML IV SOLN
9.0000 mL | Freq: Once | INTRAVENOUS | Status: AC | PRN
  Administered 2020-08-17: 9 mL via INTRAVENOUS

## 2020-08-17 MED ORDER — LORAZEPAM 2 MG/ML IJ SOLN
1.0000 mg | Freq: Once | INTRAMUSCULAR | Status: AC
  Administered 2020-08-17: 1 mg via INTRAVENOUS
  Filled 2020-08-17: qty 1

## 2020-08-17 MED ORDER — FAMOTIDINE 20 MG PO TABS: 20.0000 mg | ORAL_TABLET | Freq: Every day | ORAL | Status: DC

## 2020-08-17 MED ORDER — CLOPIDOGREL BISULFATE 75 MG PO TABS: 75.0000 mg | ORAL_TABLET | Freq: Every day | ORAL | Status: DC
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
## 2020-08-17 NOTE — ED Notes (Signed)
Patient verbalizes understanding of discharge instructions. Opportunity for questioning and answers were provided. Arm band removed by staff, patient discharged from ED. 

## 2020-08-17 NOTE — Consult Note (Signed)
Family Medicine Teaching Service ED Consult Note Service Pager: [phone]  Patient name: Jane Sparks Medical record number: [id number] Date of birth: 02-24-1966 Age: 54 y.o. Gender: female  Primary Care Provider: Mirian Mo, MD Consultants: Neurology Code Status: DNR which was confirmed with patient  Preferred Emergency Contact: Martin Majestic, [phone]  Chief Complaint: slurred speech, weakness in left hand  Assessment and Plan: Jane Sparks is a 54 y.o. female presenting with an episode yesterday of slurred speech and trouble getting words out, then 30 minutes of left hand numbness this AM. PMH is significant for A fib on Eliquis but off for 2.5 days prior to episode, anxiety, tobacco abuse, hx alcohol use.    Slurred speech and left hand weakness Last night around 5 PM after having a very stressful day at work, patient had a 20 to 30-minute episode of slurred speech during which she was able to speak, but the end of her words were very slurred.  This was noticed by the patient and her husband.  She states that she had spontaneous resolution of her symptoms, went to bed and rested.  Woke up at 3 AM today for work, was on her phone when she felt her left hand cramp up then go week.  Denies any paresthesias.  Had spontaneous resolution in about 30 minutes.  Presented to the ED.  She is neurologically intact on examination.  CT head negative in ED.  Vitals are stable, labs WNL.  She has a history of A fib, had previously been on Eliquis, but ran out in the last 2-1/2 days.  She did report taking an Eliquis last night and this morning that belonged to her husband given her episodes.  CHA2DS2-VASc of 1 without including possible TIA.  Her EKG in ED does not show A fib or any acute changes.  She has an apple watch which records when she is in atrial fibrillation and her most recent episode was 10/29.  She reports taking EKGs with her apple watch last night and this morning during the episodes  and both were normal.  Dr. Thomasena Edis with neurology recommended admission for suspected TIA and completed workup with MRI/MRA head and neck, as well as Echo, however patient wanted to avoid admission if possible.  Stroke team was consulted, suspicious for TIA, recommended obtaining MRI/MRA while in ED, add Lipitor 40mg  QD, and d/c on Eliquis. Per stroke team, okay to d/c from ED after imaging obtained and reviewed by Dr. .  Spoke with Dr. Pearlean Brownie who will ensure patient is discharged from ED.   - d/c from ED after MRI/MRA reviewed by Dr. Lockie Mola - can get A1c as outpatient for risk stratification - cont Eliquis, give new Rx on d/c - start Lipitor 40mg  QD, LDL goal <70 - repeat Echo as outpatient - f/u with PCP on 11/5 at 3:30 pm  Atrial fibrillation Diagnosed in June.  Last seen by cardiology on 8/16 and at that time had been told to discontinue Eliquis after 30 days given CHA2DS2-VASc of 1 and had not been in A fib for two appts.  She contacted her cardiologist, Dr. July on 8/20 in regards to an A fib event caught on her apple watch and was advised to continue Eliquis which she had until she ran out about 2.5 days ago.  She reports taking her husband's eliquis on 11/10 PM and 11/11 AM.  Most recent echo in June showed inferior basal hypokinesis, EF 55%, moderately dilated LA.  Currently on cardizem 120mg  QD  and reports compliance. - cont cardizem - cont Eliquis - f/u cardiology outpatient    Allergic rhinitis Reports a signifcant history of allergies.  Previously followed with an allergist in another state, but none since moving to [place].  She take zyrtec daily for her symptoms.  States that recently her symptoms have been worsening with increased congestion and sinus pressure.  No fevers, cough, sore throat, SOB. - cont zyrtec - consider outpatient allergist referral - consider addition of flonase to daily regimen  Anxiety She was started on wellbutrin on 6/25 for anxiety and  tobacco abuse, but states that it did not make her feel well so she discontinued it on her own.  Does not appear anxious on examination today. - no current medications - f/u PCP outpatient  Tobacco Use Disorder Quit smoking in June after starting patches.  Has discontinued wellbutrin per above. - nicotine patch prn   Alcohol Use Disoder Reports drinking about 1 alcoholic beverage a night, but sometimes has days when she does not drink.  No history of withdrawal. Last drink last PM, gin and tonic. - no intervention needed - in setting of A fib episodes, continue to encourage decreased consumption  Disposition: she will discharge from ED after stroke team reviews MRI and MRA  History of Present Illness:  Jane Sparks is a 54 y.o. female presenting with an episode last night of slurred speech and an episode this AM of left hand weakness.  Patient reports that last PM around 5 when she returned from a very physically demanding day at work, she started to notice that the end of her words were slurring.  She states that she was able to speak, but the end of each statement would become somewhat slurred.  She had a "sinus headache" at the time and relates this to her severe allergies.  Denies any changes in vision, aphasia, loss of consciousness, facial droop, weakness, or paresthesias.  She states that this resolved after about 30 minutes and she went to sleep to rest.    This AM, she woke up at 0300 for work.  She was using her phone, when she noticed that her left hand cramped into a claw, then released, and felt weak.  She states that she did not have any numbness or tingling at the time, but she described it as feeling "dead."  She states that this lasted about 30 minutes and then resolved spontaneously, but patient came to ED for concern of stroke.  She has an apple watch that has previously recorded episodes of A fib and was the cause of her admission in June when this was diagnosed.  She took  an EKG on her apple watch last PM and this AM during the episodes and both were NSR per the apple watch report.  She shows that on her phone, she has not had an episode of A fib since 10/29.  She states that she has otherwise been in her usual state of health and feeling well.  She has severe allergies per her report and has been having worsening congestion and sinus pressure, but states that this is not atypical for her this time of year.  Review Of Systems: Per HPI with the following additions:   Review of Systems  Constitutional: Negative for chills, fatigue and fever.  HENT: Positive for congestion and sinus pressure.   Eyes: Negative for visual disturbance.  Respiratory: Negative for cough and shortness of breath.   Cardiovascular: Negative for chest pain and  palpitations.  Gastrointestinal: Negative for abdominal pain, diarrhea, nausea and vomiting.  Genitourinary: Negative for dysuria.  Skin: Negative for rash.  Neurological: Positive for speech difficulty, weakness and headaches. Negative for dizziness, syncope and numbness.     Patient Active Problem List   Diagnosis Date Noted  . Environmental and seasonal allergies 04/01/2020  . Alcohol use   . Anxiety   . Atrial fibrillation and flutter (HCC)   . Elevated LDL cholesterol level   . Tobacco abuse   . Atrial flutter (HCC) 03/22/2020    Past Medical History: Past Medical History:  Diagnosis Date  . Alcohol use   . Anxiety   . Atrial fibrillation with RVR (HCC)   . Smoker     Past Surgical History: History reviewed. No pertinent surgical history.  Social History: Social History   Tobacco Use  . Smoking status: Former Games developermoker  . Smokeless tobacco: Never Used  . Tobacco comment: stopped after hospital stay 03/24/2020  Substance Use Topics  . Alcohol use: Yes  . Drug use: Not Currently   Additional social history: none  Please also refer to relevant sections of EMR.  Family History: Family History  Problem  Relation Age of Onset  . Atrial fibrillation Mother   . CVA Mother   . Heart disease Father     Allergies and Medications: Allergies  Allergen Reactions  . Penicillins   . Sulfa Antibiotics    No current facility-administered medications on file prior to encounter.   Current Outpatient Medications on File Prior to Encounter  Medication Sig Dispense Refill  . buPROPion (WELLBUTRIN) 75 MG tablet TAKE 1 TABLET BY MOUTH EVERY DAY 90 tablet 1  . calcium carbonate (TUMS - DOSED IN MG ELEMENTAL CALCIUM) 500 MG chewable tablet Chew 1 tablet by mouth daily as needed for indigestion or heartburn.    . cetirizine (ZYRTEC) 10 MG tablet Take 10 mg by mouth daily.    . cholecalciferol (VITAMIN D3) 25 MCG (1000 UNIT) tablet Take 1,000 Units by mouth daily.    Marland Kitchen. diltiazem (CARDIZEM CD) 120 MG 24 hr capsule TAKE 1 CAPSULE BY MOUTH DAILY 90 capsule 1  . diphenhydrAMINE (BENADRYL) 25 MG tablet Take 25 mg by mouth as needed for allergies.     . famotidine (PEPCID) 20 MG tablet Take 20 mg by mouth as needed for heartburn or indigestion.       Objective: BP 128/79   Pulse 65   Temp 97.9 F (36.6 C) (Oral)   Resp 12   SpO2 100%   Physical Exam:  General: 54 y.o. female in NAD HEENT: NCAT, EOMI, PERRL Neck: supple, full ROM Cardio: RRR no m/r/g Lungs: CTAB, no wheezing, no rhonchi, no crackles, no IWOB on RA Abdomen: Soft, non-tender to palpation, non-distended Skin: warm and dry Extremities: No edema, 5/5 strength BUE/BLE Neuro: A&Ox4, CN II-XII grossly intact, sensation intact throughout Psych: mood and affect appropriate for circumstance   Labs and Imaging: CBC BMET  Recent Labs  Lab 08/17/20 0457 08/17/20 0457 08/17/20 0505  WBC 6.5  --   --   HGB 13.1   < > 13.9  HCT 39.7   < > 41.0  PLT 326  --   --    < > = values in this interval not displayed.   Recent Labs  Lab 08/17/20 0457 08/17/20 0457 08/17/20 0505  NA 139   < > 142  K 3.9   < > 3.9  CL 106   < > 107  CO2 23   --   --   BUN 13   < > 15  CREATININE 0.91   < > 0.80  GLUCOSE 97   < > 93  CALCIUM 9.2  --   --    < > = values in this interval not displayed.     EKG: NSR, no acute changes  CT HEAD WO CONTRAST  Result Date: 08/17/2020 CLINICAL DATA:  Transient slurred speech and left hand weakness this morning. EXAM: CT HEAD WITHOUT CONTRAST TECHNIQUE: Contiguous axial images were obtained from the base of the skull through the vertex without intravenous contrast. COMPARISON:  None. FINDINGS: Brain: The ventricles are normal in size and configuration. No extra-axial fluid collections are identified. The gray-white differentiation is maintained. No CT findings for acute hemispheric infarction or intracranial hemorrhage. No mass lesions. The brainstem and cerebellum are normal. Vascular: No hyperdense vessels or obvious aneurysm. Skull: No acute skull fracture. No bone lesion. Sinuses/Orbits: The paranasal sinuses and mastoid air cells are clear. The globes are intact. Other: No scalp lesions, laceration or hematoma. IMPRESSION: Normal head CT. Electronically Signed   By: Rudie Meyer M.D.   On: 08/17/2020 05:07     Jane Sparks, Solmon Ice, DO 08/17/2020, 9:27 AM PGY-3, [facility] Intern pager: [phone], text pages welcome

## 2020-08-17 NOTE — ED Triage Notes (Signed)
Pt reports that yesterday she has slurred speech yesterday and trouble getting her words out, this morning around 330 she woke up and had L hand weakness which has also resolved. Neuro intact bilaterally. Hx of afib, on eliquis

## 2020-08-17 NOTE — Discharge Instructions (Addendum)
Make sure that you take your medications as prescribed.  Follow-up with neurology, they should call you with an appointment but please call to confirm.  Please return to the ED if symptoms worsen as discussed.  I have also given you the number to follow-up with cardiology for A. fib.

## 2020-08-17 NOTE — ED Notes (Signed)
Off floor to MRI

## 2020-08-17 NOTE — ED Provider Notes (Addendum)
[facility]   CSN: [id number] Arrival date & time: 08/17/20  0439     History Chief Complaint  Patient presents with  . Transient Ischemic Attack    Jane Sparks is a 54 y.o. female.  Patient with history of A. fib on Eliquis and diltiazem, former smoker who presents to the ED after having 2 episodes of possible neurologic issues.  Brown yesterday at 5 PM she had slurred speech for about 20 to 30 minutes.  Did not have any facial droop or weakness or numbness.  Resolved on its own.  She said she had been really tired from working all day and had a mild headache during that time as well.  She felt better and then she got up early this morning around 3 AM and was doing okay and then she had about 20 to 30 minutes of left hand weakness.  That has now resolved.  She did not have any other neurologic compromise including numbness during that time.  Overall she feels well now.  She felt like she might have been dehydrated.  The history is provided by the patient.  Neurologic Problem This is a new problem. The current episode started yesterday. The problem has been resolved. Pertinent negatives include no chest pain, no abdominal pain, no headaches and no shortness of breath. Nothing aggravates the symptoms. Nothing relieves the symptoms. She has tried nothing for the symptoms. The treatment provided significant relief.       Past Medical History:  Diagnosis Date  . Alcohol use   . Anxiety   . Atrial fibrillation with RVR (HCC)   . Smoker     Patient Active Problem List   Diagnosis Date Noted  . Environmental and seasonal allergies 04/01/2020  . Alcohol use   . Anxiety   . Atrial fibrillation and flutter (HCC)   . Elevated LDL cholesterol level   . Tobacco abuse   . Atrial flutter (HCC) 03/22/2020    History reviewed. No pertinent surgical history.   OB History   No obstetric history on file.     Family History    Problem Relation Age of Onset  . Atrial fibrillation Mother   . CVA Mother   . Heart disease Father     Social History   Tobacco Use  . Smoking status: Former Games developermoker  . Smokeless tobacco: Never Used  . Tobacco comment: stopped after hospital stay 03/24/2020  Substance Use Topics  . Alcohol use: Yes  . Drug use: Not Currently    Home Medications Prior to Admission medications   Medication Sig Start Date End Date Taking? Authorizing Provider  buPROPion (WELLBUTRIN) 75 MG tablet TAKE 1 TABLET BY MOUTH EVERY DAY 04/26/20   Mirian MoFrank, Peter, MD  apixaban (ELIQUIS) 5 MG TABS tablet Take 1 tablet (5 mg total) by mouth 2 (two) times daily. 08/17/20 09/16/20  Yuna Pizzolato, DO  atorvastatin (LIPITOR) 40 MG tablet Take 1 tablet (40 mg total) by mouth daily. 08/17/20 09/16/20  Billiejo Sorto, DO  calcium carbonate (TUMS - DOSED IN MG ELEMENTAL CALCIUM) 500 MG chewable tablet Chew 1 tablet by mouth daily as needed for indigestion or heartburn.    [provider]  cetirizine (ZYRTEC) 10 MG tablet Take 10 mg by mouth daily.    [provider]  cholecalciferol (VITAMIN D3) 25 MCG (1000 UNIT) tablet Take 1,000 Units by mouth daily.    [provider]  diltiazem (CARDIZEM CD) 120 MG 24 hr  capsule TAKE 1 CAPSULE BY MOUTH DAILY 08/09/20   Mirian Mo, MD  diphenhydrAMINE (BENADRYL) 25 MG tablet Take 25 mg by mouth as needed for allergies.     [provider]  famotidine (PEPCID) 20 MG tablet Take 20 mg by mouth as needed for heartburn or indigestion.     [provider]    Allergies    Penicillins and Sulfa antibiotics  Review of Systems   Review of Systems  Constitutional: Negative for chills and fever.  HENT: Negative for ear pain and sore throat.   Eyes: Negative for pain and visual disturbance.  Respiratory: Negative for cough and shortness of breath.   Cardiovascular: Negative for chest pain and palpitations.  Gastrointestinal: Negative for  abdominal pain and vomiting.  Genitourinary: Negative for dysuria and hematuria.  Musculoskeletal: Negative for arthralgias and back pain.  Skin: Negative for color change and rash.  Neurological: Positive for speech difficulty and weakness. Negative for seizures, syncope, numbness and headaches.  All other systems reviewed and are negative.   Physical Exam Updated Vital Signs  ED Triage Vitals [08/17/20 0444]  Enc Vitals Group     BP (!) 152/89     Pulse Rate 93     Resp 16     Temp 97.9 F (36.6 C)     Temp Source Oral     SpO2 98 %     Weight      Height      Head Circumference      Peak Flow      Pain Score 0     Pain Loc      Pain Edu?      Excl. in GC?     Physical Exam Vitals and nursing note reviewed.  Constitutional:      General: She is not in acute distress.    Appearance: She is well-developed. She is not ill-appearing.  HENT:     Head: Normocephalic and atraumatic.     Nose: Nose normal.     Mouth/Throat:     Mouth: Mucous membranes are moist.  Eyes:     Extraocular Movements: Extraocular movements intact.     Conjunctiva/sclera: Conjunctivae normal.     Pupils: Pupils are equal, round, and reactive to light.  Cardiovascular:     Rate and Rhythm: Normal rate and regular rhythm.     Pulses: Normal pulses.     Heart sounds: Normal heart sounds. No murmur heard.   Pulmonary:     Effort: Pulmonary effort is normal. No respiratory distress.     Breath sounds: Normal breath sounds.  Abdominal:     Palpations: Abdomen is soft.     Tenderness: There is no abdominal tenderness.  Musculoskeletal:     Cervical back: Normal range of motion and neck supple.  Skin:    General: Skin is warm and dry.  Neurological:     General: No focal deficit present.     Mental Status: She is alert and oriented to person, place, and time.     Cranial Nerves: No cranial nerve deficit.     Sensory: No sensory deficit.     Motor: No weakness.     Coordination:  Coordination normal.     Comments: 5+ out of 5 strength throughout, normal sensation, normal speech, normal finger-to-nose finger, no drift, no visual field deficit     ED Results / Procedures / Treatments   Labs (all labs ordered are listed, but only abnormal results are displayed)  Labs Reviewed  PROTIME-INR - Abnormal; Notable for the following components:      Result Value   Prothrombin Time 17.3 (*)    INR 1.5 (*)    All other components within normal limits  LIPID PANEL - Abnormal; Notable for the following components:   HDL 34 (*)    LDL Cholesterol 100 (*)    All other components within normal limits  I-STAT CHEM 8, ED - Abnormal; Notable for the following components:   Calcium, Ion 1.10 (*)    TCO2 21 (*)    All other components within normal limits  APTT  CBC  DIFFERENTIAL  COMPREHENSIVE METABOLIC PANEL  HEMOGLOBIN A1C  CBG MONITORING, ED  I-STAT BETA HCG BLOOD, ED ([facility], [facility], [facility] ONLY)    EKG EKG Interpretation  Date/Time:  Thursday August 17 2020 07:35:54 EST Ventricular Rate:  70 PR Interval:    QRS Duration: 78 QT Interval:  414 QTC Calculation: 447 R Axis:   66 Text Interpretation: Sinus rhythm Low voltage, precordial leads Confirmed by Virgina Norfolk [phone]) on 08/17/2020 7:39:26 AM   Radiology CT HEAD WO CONTRAST  Result Date: 08/17/2020 CLINICAL DATA:  Transient slurred speech and left hand weakness this morning. EXAM: CT HEAD WITHOUT CONTRAST TECHNIQUE: Contiguous axial images were obtained from the base of the skull through the vertex without intravenous contrast. COMPARISON:  None. FINDINGS: Brain: The ventricles are normal in size and configuration. No extra-axial fluid collections are identified. The gray-white differentiation is maintained. No CT findings for acute hemispheric infarction or intracranial hemorrhage. No mass lesions. The brainstem and cerebellum are normal. Vascular: No hyperdense vessels or obvious aneurysm. Skull: No acute skull  fracture. No bone lesion. Sinuses/Orbits: The paranasal sinuses and mastoid air cells are clear. The globes are intact. Other: No scalp lesions, laceration or hematoma. IMPRESSION: Normal head CT. Electronically Signed   By: Rudie Meyer M.D.   On: 08/17/2020 05:07   MR ANGIO HEAD WO CONTRAST  Result Date: 08/17/2020 CLINICAL DATA:  Transient ischemic attack. Slurred speech and left hand weakness. EXAM: MRI HEAD WITHOUT AND WITH CONTRAST MRA HEAD WITHOUT CONTRAST MRA NECK WITHOUT AND WITH CONTRAST TECHNIQUE: Multiplanar, multiecho pulse sequences of the brain and surrounding structures were obtained without and with intravenous contrast. Angiographic images of the Circle of Willis were obtained using MRA technique without intravenous contrast. Angiographic images of the neck were obtained using MRA technique without and with intravenous contrast. Carotid stenosis measurements (when applicable) are obtained utilizing NASCET criteria, using the distal internal carotid diameter as the denominator. CONTRAST:  56mL GADAVIST GADOBUTROL 1 MMOL/ML IV SOLN COMPARISON:  Head CT earlier same day FINDINGS: MRI HEAD FINDINGS Brain: Diffusion imaging shows areas of acute infarction in the right MCA territory. There is a focal 1 x 2 cm region of infarction affecting the right posterior frontal cortex. There is a larger region of cortical acute infarction at the right temporoparietal junction. No evidence of swelling or hemorrhage. Otherwise, the brain appears normal without evidence of any older insult. No mass, hydrocephalus or extra-axial collection. Vascular: Major vessels at the base of the brain show flow. Skull and upper cervical spine: Negative Sinuses/Orbits: Clear/normal Other: None MRA HEAD FINDINGS Both internal carotid arteries are widely patent through the skull base and siphon regions. The anterior and middle cerebral vessels are patent without proximal stenosis, aneurysm or vascular malformation. No large or  medium vessel occlusion can be demonstrated on the right. Both posterior cerebral arteries take fetal origin from the anterior  circulation. Both vertebral arteries are patent at the foramen magnum. The right terminates in PICA. The left supplies the small basilar artery. Basilar artery terminates or nearly terminates in the superior cerebellar arteries. PCAs receive there supply from the anterior circulation. MRA NECK FINDINGS Both common carotid arteries widely patent to the bifurcation. Both carotid bifurcations are normal. No stenosis or irregularity. Both cervical internal carotid arteries are widely patent. Both vertebral arteries are patent. The origins are not well seen because of chest motion. Both vessels are patent through the neck to the foramen magnum. IMPRESSION: 1. Acute infarction in the right MCA territory affecting the posterior frontal cortex and a separate region in the right temporoparietal junction region. No evidence of swelling or hemorrhage at this time. Findings are consistent with embolic disease to the right MCA territory. 2. No intracranial large or medium vessel occlusion or correctable proximal stenosis visible at this time. 3. No evidence of neck vessel pathology. Electronically Signed   By: Paulina Fusi M.D.   On: 08/17/2020 14:35   MR Angiogram Neck W or Wo Contrast  Result Date: 08/17/2020 CLINICAL DATA:  Transient ischemic attack. Slurred speech and left hand weakness. EXAM: MRI HEAD WITHOUT AND WITH CONTRAST MRA HEAD WITHOUT CONTRAST MRA NECK WITHOUT AND WITH CONTRAST TECHNIQUE: Multiplanar, multiecho pulse sequences of the brain and surrounding structures were obtained without and with intravenous contrast. Angiographic images of the Circle of Willis were obtained using MRA technique without intravenous contrast. Angiographic images of the neck were obtained using MRA technique without and with intravenous contrast. Carotid stenosis measurements (when applicable) are  obtained utilizing NASCET criteria, using the distal internal carotid diameter as the denominator. CONTRAST:  76mL GADAVIST GADOBUTROL 1 MMOL/ML IV SOLN COMPARISON:  Head CT earlier same day FINDINGS: MRI HEAD FINDINGS Brain: Diffusion imaging shows areas of acute infarction in the right MCA territory. There is a focal 1 x 2 cm region of infarction affecting the right posterior frontal cortex. There is a larger region of cortical acute infarction at the right temporoparietal junction. No evidence of swelling or hemorrhage. Otherwise, the brain appears normal without evidence of any older insult. No mass, hydrocephalus or extra-axial collection. Vascular: Major vessels at the base of the brain show flow. Skull and upper cervical spine: Negative Sinuses/Orbits: Clear/normal Other: None MRA HEAD FINDINGS Both internal carotid arteries are widely patent through the skull base and siphon regions. The anterior and middle cerebral vessels are patent without proximal stenosis, aneurysm or vascular malformation. No large or medium vessel occlusion can be demonstrated on the right. Both posterior cerebral arteries take fetal origin from the anterior circulation. Both vertebral arteries are patent at the foramen magnum. The right terminates in PICA. The left supplies the small basilar artery. Basilar artery terminates or nearly terminates in the superior cerebellar arteries. PCAs receive there supply from the anterior circulation. MRA NECK FINDINGS Both common carotid arteries widely patent to the bifurcation. Both carotid bifurcations are normal. No stenosis or irregularity. Both cervical internal carotid arteries are widely patent. Both vertebral arteries are patent. The origins are not well seen because of chest motion. Both vessels are patent through the neck to the foramen magnum. IMPRESSION: 1. Acute infarction in the right MCA territory affecting the posterior frontal cortex and a separate region in the right  temporoparietal junction region. No evidence of swelling or hemorrhage at this time. Findings are consistent with embolic disease to the right MCA territory. 2. No intracranial large or medium vessel occlusion or correctable  proximal stenosis visible at this time. 3. No evidence of neck vessel pathology. Electronically Signed   By: Paulina Fusi M.D.   On: 08/17/2020 14:35   MR Brain Wo Contrast (neuro protocol)  Result Date: 08/17/2020 CLINICAL DATA:  Transient ischemic attack. Slurred speech and left hand weakness. EXAM: MRI HEAD WITHOUT AND WITH CONTRAST MRA HEAD WITHOUT CONTRAST MRA NECK WITHOUT AND WITH CONTRAST TECHNIQUE: Multiplanar, multiecho pulse sequences of the brain and surrounding structures were obtained without and with intravenous contrast. Angiographic images of the Circle of Willis were obtained using MRA technique without intravenous contrast. Angiographic images of the neck were obtained using MRA technique without and with intravenous contrast. Carotid stenosis measurements (when applicable) are obtained utilizing NASCET criteria, using the distal internal carotid diameter as the denominator. CONTRAST:  1mL GADAVIST GADOBUTROL 1 MMOL/ML IV SOLN COMPARISON:  Head CT earlier same day FINDINGS: MRI HEAD FINDINGS Brain: Diffusion imaging shows areas of acute infarction in the right MCA territory. There is a focal 1 x 2 cm region of infarction affecting the right posterior frontal cortex. There is a larger region of cortical acute infarction at the right temporoparietal junction. No evidence of swelling or hemorrhage. Otherwise, the brain appears normal without evidence of any older insult. No mass, hydrocephalus or extra-axial collection. Vascular: Major vessels at the base of the brain show flow. Skull and upper cervical spine: Negative Sinuses/Orbits: Clear/normal Other: None MRA HEAD FINDINGS Both internal carotid arteries are widely patent through the skull base and siphon regions. The  anterior and middle cerebral vessels are patent without proximal stenosis, aneurysm or vascular malformation. No large or medium vessel occlusion can be demonstrated on the right. Both posterior cerebral arteries take fetal origin from the anterior circulation. Both vertebral arteries are patent at the foramen magnum. The right terminates in PICA. The left supplies the small basilar artery. Basilar artery terminates or nearly terminates in the superior cerebellar arteries. PCAs receive there supply from the anterior circulation. MRA NECK FINDINGS Both common carotid arteries widely patent to the bifurcation. Both carotid bifurcations are normal. No stenosis or irregularity. Both cervical internal carotid arteries are widely patent. Both vertebral arteries are patent. The origins are not well seen because of chest motion. Both vessels are patent through the neck to the foramen magnum. IMPRESSION: 1. Acute infarction in the right MCA territory affecting the posterior frontal cortex and a separate region in the right temporoparietal junction region. No evidence of swelling or hemorrhage at this time. Findings are consistent with embolic disease to the right MCA territory. 2. No intracranial large or medium vessel occlusion or correctable proximal stenosis visible at this time. 3. No evidence of neck vessel pathology. Electronically Signed   By: Paulina Fusi M.D.   On: 08/17/2020 14:35    Procedures .Critical Care Performed by: Virgina Norfolk, DO Authorized by: Virgina Norfolk, DO   Critical care provider statement:    Critical care time (minutes):  45   Critical care was necessary to treat or prevent imminent or life-threatening deterioration of the following conditions:  CNS failure or compromise   Critical care was time spent personally by me on the following activities:  Discussions with consultants, evaluation of patient's response to treatment, examination of patient, ordering and performing treatments  and interventions, ordering and review of laboratory studies, ordering and review of radiographic studies, pulse oximetry, re-evaluation of patient's condition, obtaining history from patient or surrogate and review of old charts   (including critical care time)  Medications  Ordered in ED Medications  sodium chloride flush (NS) 0.9 % injection 3 mL (3 mLs Intravenous Not Given 08/17/20 0843)  LORazepam (ATIVAN) injection 1 mg (1 mg Intravenous Given 08/17/20 1035)  gadobutrol (GADAVIST) 1 MMOL/ML injection 9 mL (9 mLs Intravenous Contrast Given 08/17/20 1425)    ED Course  I have reviewed the triage vital signs and the nursing notes.  Pertinent labs & imaging results that were available during my care of the patient were reviewed by me and considered in my medical decision making (see chart for details).    MDM Rules/Calculators/A&P                          Jane Sparks is a 54 year old female with history of atrial fibrillation on Eliquis, high cholesterol, former smoker who presents the ED with TIA symptoms.  Unremarkable vitals.  No fever.  Had 2 episodes of possible neurologic compromise in the last 12 hours.  5 PM yesterday she had slurred speech for about 20 to 30 minutes and at 330 this morning she had about 20 to 30 minutes the left hand weakness that both resolved on their own.  Currently she is neurologically intact.  She has already had lab work and head CT and EKG prior to my evaluation that are all unremarkable.  No ischemic changes on EKG.  She is not in A. fib.  Head CT showed no acute findings.  No significant anemia, electrolyte abnormality, kidney injury.  Talked with Dr. Thomasena Edis with neurology who recommends MRI and MRA imaging of the head.  Neurology has evaluated the patient and recommends MRI imaging and the likely will start a statin and believe patient can continue follow-up outpatient.  Awaiting MRI imaging and final neurology recommendations.  Talked with neurology  about MRI and there is evidence of embolic stroke.  Radiology confirmed this with the report as well.  She does have a acute infarction in the right MCA affecting the posterior frontal cortex and a separate region in the right temporoparietal junction.  Dr. Pearlean Brownie with neurology recommends starting patient on atorvastatin and have them continue Eliquis and will follow up with the patient outpatient.  Discharged in good condition.  Neurologically intact on my care.  This chart was dictated using voice recognition software.  Despite best efforts to proofread,  errors can occur which can change the documentation meaning.    Final Clinical Impression(s) / ED Diagnoses Final diagnoses:  Cerebrovascular accident (CVA) due to embolism of precerebral artery (HCC)    Rx / DC Orders ED Discharge Orders         Ordered    Ambulatory referral to Neurology       Comments: An appointment is requested in approximately: 2 weeks   08/17/20 1312    apixaban (ELIQUIS) 5 MG TABS tablet  2 times daily        08/17/20 1321    atorvastatin (LIPITOR) 40 MG tablet  Daily        08/17/20 1321           Tomy Khim, Madelaine Bhat, DO 08/17/20 0800    Virgina Norfolk, DO 08/17/20 1443

## 2020-08-17 NOTE — ED Notes (Signed)
Back from MRI.

## 2020-08-18 ENCOUNTER — Encounter: Payer: Self-pay | Admitting: Family Medicine

## 2020-08-21 ENCOUNTER — Inpatient Hospital Stay: Payer: Managed Care, Other (non HMO) | Admitting: Family Medicine

## 2020-08-22 NOTE — Progress Notes (Signed)
Subjective:   Patient ID: Jane Sparks    DOB: [birth year], 54 y.o. female   MRN: [id number]  Jane Sparks is a 54 y.o. female with a history of A fib/flutter on Eliquis and Dilt, alcohol use, anxiety, elevated LDL, environmental/seasonal allergies, former smoker (quit in June) here for Hospital follow up.  Hospital Follow Up for CVA: Patient was seen in ED on 08/17/20 for episodes of slurred speech that self resolved without facial droop, weakness, or numbness. At time of assessment her symptoms had resolved and she was neurologically intact. Head CT and EKG were normal. CBC, CMP normal. Neurology was consulted and recommended MRI/MRA which showed evidence of embolic stroke in right MCA affecting the posterior frontal cortex and a separate region in right temporoparietal junction. She was started on Atorvastatin and continued on Eliquis. She was referred to neurology. Today she notes she is doing well. Denies any residual symptoms or recurrence.  She is a former smoker, quit in June. Patient notes that she missed a few doses of Eliquis prior to the event and then had her stroke. Plan to call back neurology to reschedule follow up appointment.  Depression: Patient contacted clinic following hospital visit noted that this event has exacerbated her anxiety. She discontinued the Wellbutrin and was interested in starting Celexa as her daughter has had good success ont his medication. She was given list of counslors/psychiatrists and feels like she has good support and coping mechanisms.  Today she notes she feels sad, angry every day. She denies SI/HI but she is angry and wouldn't care if she didn't wake up tomorrow. Protective measures include mother and husband. Sleeping well at night. Wants to stay in bed all the time. She feels trapped and dependent on medicines to live. She hates this feeling. She feels like she is in a rut and cant get out.   PHQ-9 score: 5, answer 0 to question 5  Review of  Systems:  Per HPI.   Objective:   BP 112/80   Pulse 86   Wt 195 lb 6.4 oz (88.6 kg)   SpO2 98%   BMI 33.54 kg/m  Vitals and nursing note reviewed.  General: pleasant middle aged woman, tearful at times during exam, sitting comfortably in exam chair, well nourished, well developed, in no acute distress with non-toxic appearance Resp: breathing comfortably on room air, speaking in full sentences MSK: gait normal Neuro: Alert and oriented, speech normal  Assessment & Plan:   TIA due to embolism (HCC) Acute and resolved.  No residual effects or recurrence. Continue Atorvastatin, Eliquis. Former smoker Blood pressure well controlled Patient to schedule follow up with neurology at earliest convenience.  Current severe episode of major depressive disorder without psychotic features (HCC) Endorses symptoms concerning for MDD, exacerbated from previous TIA. PHQ-9 score of 5 today. Denies SI/HI. Interested in trying Celexa as her daughter has had success, however given cardiac history we opted to try Zoloft. Will start with 50mg  QD and titrate up to 100mg  QD if tolerated. Follow up in 1 month to reassess.   No orders of the defined types were placed in this encounter.  Meds ordered this encounter  Medications  . DISCONTD: citalopram (CELEXA) 20 MG tablet    Sig: Take 1 tablet (20 mg total) by mouth daily.    Dispense:  30 tablet    Refill:  3  . diltiazem (CARDIZEM CD) 120 MG 24 hr capsule    Sig: Take 1 capsule (120 mg total) by mouth daily.  Dispense:  90 capsule    Refill:  1  . sertraline (ZOLOFT) 50 MG tablet    Sig: Take 1 tablet (50 mg total) by mouth daily.    Dispense:  60 tablet    Refill:  0    Orpah Cobb, DO PGY-3, [facility] 08/23/2020 1:50 PM

## 2020-08-23 ENCOUNTER — Ambulatory Visit: Payer: Managed Care, Other (non HMO) | Admitting: Family Medicine

## 2020-08-23 ENCOUNTER — Other Ambulatory Visit: Payer: Self-pay

## 2020-08-23 ENCOUNTER — Encounter: Payer: Self-pay | Admitting: Family Medicine

## 2020-08-23 VITALS — BP 112/80 | HR 86 | Wt 195.4 lb

## 2020-08-23 DIAGNOSIS — G459 Transient cerebral ischemic attack, unspecified: Secondary | ICD-10-CM | POA: Insufficient documentation

## 2020-08-23 DIAGNOSIS — I4892 Unspecified atrial flutter: Secondary | ICD-10-CM | POA: Diagnosis not present

## 2020-08-23 DIAGNOSIS — F322 Major depressive disorder, single episode, severe without psychotic features: Secondary | ICD-10-CM | POA: Diagnosis not present

## 2020-08-23 DIAGNOSIS — Z8673 Personal history of transient ischemic attack (TIA), and cerebral infarction without residual deficits: Secondary | ICD-10-CM | POA: Diagnosis not present

## 2020-08-23 DIAGNOSIS — I749 Embolism and thrombosis of unspecified artery: Secondary | ICD-10-CM | POA: Insufficient documentation

## 2020-08-23 MED ORDER — CITALOPRAM HYDROBROMIDE 20 MG PO TABS: 20.0000 mg | ORAL_TABLET | Freq: Every day | ORAL | 3 refills | Status: DC

## 2020-08-23 MED ORDER — SERTRALINE HCL 50 MG PO TABS: 50.0000 mg | ORAL_TABLET | Freq: Every day | ORAL | 0 refills | Status: DC

## 2020-08-23 MED ORDER — DILTIAZEM HCL ER COATED BEADS 120 MG PO CP24: 120.0000 mg | ORAL_CAPSULE | Freq: Every day | ORAL | 1 refills | Status: DC

## 2020-08-23 NOTE — Assessment & Plan Note (Signed)
Endorses symptoms concerning for MDD, exacerbated from previous TIA. PHQ-9 score of 5 today. Denies SI/HI. Interested in trying Celexa as her daughter has had success, however given cardiac history we opted to try Zoloft. Will start with 50mg  QD and titrate up to 100mg  QD if tolerated. Follow up in 1 month to reassess.

## 2020-08-23 NOTE — Assessment & Plan Note (Addendum)
Acute and resolved.  No residual effects or recurrence. Continue Atorvastatin, Eliquis. Former smoker Blood pressure well controlled Patient to schedule follow up with neurology at earliest convenience.

## 2020-08-23 NOTE — Patient Instructions (Addendum)
It was a pleasure to see you today!  Thank you for choosing [facility] for your primary care.  Jane Sparks was seen for follow up.   Our plans for today were:  Start Celexa 20mg  daily. Can increase to 40mg  (two tablets) in 2 weeks if tolerating.   Lets follow up in 1 month to see how things are going.  Best Wishes,   , DO   If you are feeling suicidal or depression symptoms worsen please immediately go to:   24 Hour Availability [facility]  [address] [place], [address] [place] [facility] [phone]    . If you are thinking about harming yourself or having thoughts of suicide, or if you know someone who is, seek help right away. . Call your doctor or mental health care provider. . Call 911 or go to a hospital emergency room to get immediate help, or ask a friend or family member to help you do these things. . Call the [phone] National Suicide Prevention Lifeline's toll-free, 24-hour hotline at 1-800-273-TALK 347 400 2645) or TTY: 1-800-799-4 TTY (440) 236-2120) to talk to a trained counselor. . If you are in crisis, make sure you are not left alone.  . If someone else is in crisis, make sure he or she is not left alone   [facility] the 02-04-1998 (Domestic Violence, Rape & Victim Assistance [phone]  [facility]    (ONLY from 8am-4pm)    [phone]  [facility] Crisis [facility] (24/7)   ([phone]  329-924-2683 National Suicide Hotline   [phone] [place])

## 2020-08-25 ENCOUNTER — Telehealth: Payer: Self-pay

## 2020-08-25 NOTE — Telephone Encounter (Signed)
Patient calls nurse line regarding concerns with new medication, Zoloft. Patient that reports after taking medication on Wednesday night, she began feeling like she was going into a-fib and had drank 3 red bulls. Patient states that she had not taken additional dose of medication since then. Patient denies chest pain, SHOB, difficulty breathing. Patient reports HR of 93 and does not feel like she needs to go to the ED. Advised patient to also discuss with cardiologist. Patient verbalizes understanding.   Please advise how patient should proceed with medication management.   Veronda Prude, RN

## 2020-08-28 NOTE — Telephone Encounter (Signed)
Called patient.  She reports that she went into A fib taking zolfot and has stopped taking it.  Advised that she should continue to hold the medication.  Has appt with Dr. Mauri Reading on 12/21, can f/u then or sooner as needed.

## 2020-09-18 ENCOUNTER — Encounter: Payer: Self-pay | Admitting: Family Medicine

## 2020-09-18 MED ORDER — ATORVASTATIN CALCIUM 40 MG PO TABS: 40.0000 mg | ORAL_TABLET | Freq: Every day | ORAL | 1 refills | Status: DC

## 2020-09-21 ENCOUNTER — Telehealth: Payer: Self-pay

## 2020-09-21 MED ORDER — ATORVASTATIN CALCIUM 40 MG PO TABS: 40.0000 mg | ORAL_TABLET | Freq: Every day | ORAL | 1 refills | Status: DC

## 2020-09-21 NOTE — Telephone Encounter (Signed)
Patient sends mychart message in regards to Atorvastatin still not being at her pharmacy. Looks like the prescription was set to "print." Will resend to pharmacy as "normal."   Prescription is auto setting back to print each time it is resent as normal. Will call the pharmacy to give verbal.

## 2020-09-25 NOTE — Progress Notes (Signed)
   Subjective:   Patient ID: Jane Sparks    DOB: 11/20/1965, 54 y.o. female   MRN: [id number]  Theadora Noyes is a 54 y.o. female with a history of A-fib/flutter, TIA, alcohol use, anxiety, depression, elevated LDL, environmental and seasonal allergies, former smoker here for depression follow up.  MDD: Patient presented on 11/17 with significant MDD exacerbated by her recent TIA. Her PHQ-9 score at that time was 5. She was started on Zoloft 50mg  QD and was supposed to titrate up to 100mg  QD. She notes that she self discontinued this after one dose due to palpitations. She said she had "afib for 4 days" and just couldn't tolerate it. She notes that her symptoms have improved about 20% since she got out of the hospital. She notes she had an afib attack last week when she got really angry. She thinks that her anger sets it off.  Has been doing CBG gummies in the morning and the afternoon and it helps her feel less anxious. She takes 10mg . It also helps her sleep. She is sleeping well at night. She can take it as needed when she gets worked up and it helps calm her down.  Today her PHQ-9 score is 3.  Denies SI/HI  Review of Systems:  Per HPI.   Objective:   BP 120/80   Pulse 73   Wt 195 lb 9.6 oz (88.7 kg)   SpO2 98%   BMI 33.57 kg/m  Vitals and nursing note reviewed.  General: pleasant middle aged woman, sitting comfortably in exam chair, well nourished, well developed, in no acute distress with non-toxic appearance Resp: breathing comfortably on room air, speaking in full sentences MSK:  gait normal Neuro: Alert and oriented, speech normal Psych:  Cognition and judgment appear intact. Alert, communicative  and cooperative with normal attention span and concentration. No apparent delusions, illusions, hallucinations.   Depression screen [facility] 2/9 09/26/2020 08/23/2020 03/31/2020  Decreased Interest 1 1 1   Down, Depressed, Hopeless 1 1 1   PHQ - 2 Score 2 2 2   Altered sleeping 0 1 1   Tired, decreased energy 1 1 1   Change in appetite 0 0 0  Feeling bad or failure about yourself  0 0 0  Trouble concentrating 0 1 0  Moving slowly or fidgety/restless 0 0 0  Suicidal thoughts 0 0 0  PHQ-9 Score 3 5 4   Difficult doing work/chores Not difficult at all - Not difficult at all   Assessment & Plan:   Current severe episode of major depressive disorder without psychotic features (HCC) Chronic, improved. Exacerbated from previous TIA. PHQ-9 score of 3, improved from 5 prior. Subjective improvement per patient as well. No SI/HI. Self discontinued Zoloft due to palpitations that lasted 4 days in setting of known a.fib. Improvement in symptoms with PRN CBD gummies. Would like to continue this at this time. Denies any side effects. Encouraged to follow up if worsening symptoms or desires alternative pharmacologic or psychologic interventions. She voiced understanding and agreement in plan.  Health Maintenance: Health Maintenance Due  Topic  . TETANUS/TDAP   . PAP SMEAR-Modifier   . MAMMOGRAM   . COLONOSCOPY   . INFLUENZA VACCINE   . COVID-19 Vaccine (3 - Booster for 09/28/2020 series)   Patient encouraged to follow up at earliest convenience for annual exam and health maintenance.   08/25/2020, DO PGY-3, [facility] 09/26/2020 8:58 AM

## 2020-09-26 ENCOUNTER — Encounter: Payer: Self-pay | Admitting: Family Medicine

## 2020-09-26 ENCOUNTER — Other Ambulatory Visit: Payer: Self-pay

## 2020-09-26 ENCOUNTER — Ambulatory Visit: Payer: Managed Care, Other (non HMO) | Admitting: Family Medicine

## 2020-09-26 DIAGNOSIS — F322 Major depressive disorder, single episode, severe without psychotic features: Secondary | ICD-10-CM | POA: Diagnosis not present

## 2020-09-26 NOTE — Patient Instructions (Signed)
It was a pleasure to see you today!  Thank you for choosing [facility] for your primary care.    Our plans for today were:  I am glad the CBD is working well to improve your symptoms.   Please follow up if you ever feel the CBD is no longer helping control your symptoms.  Please have an amazing holiday  I encourage scheduling an annual exam at your earliest convenience.  Best Wishes,   Orpah Cobb, DO

## 2020-09-26 NOTE — Assessment & Plan Note (Addendum)
Chronic, improved. Exacerbated from previous TIA. PHQ-9 score of 3, improved from 5 prior. Subjective improvement per patient as well. No SI/HI. Self discontinued Zoloft due to palpitations that lasted 4 days in setting of known a.fib. Improvement in symptoms with PRN CBD gummies. Would like to continue this at this time. Denies any side effects. Encouraged to follow up if worsening symptoms or desires alternative pharmacologic or psychologic interventions. She voiced understanding and agreement in plan.

## 2020-10-25 ENCOUNTER — Other Ambulatory Visit: Payer: Self-pay

## 2020-10-25 ENCOUNTER — Encounter: Payer: Self-pay | Admitting: Family Medicine

## 2020-10-25 MED ORDER — APIXABAN 5 MG PO TABS: 5.0000 mg | ORAL_TABLET | Freq: Two times a day (BID) | ORAL | 1 refills | Status: DC

## 2020-10-30 ENCOUNTER — Ambulatory Visit (INDEPENDENT_AMBULATORY_CARE_PROVIDER_SITE_OTHER): Payer: Managed Care, Other (non HMO) | Admitting: Neurology

## 2020-10-30 ENCOUNTER — Encounter: Payer: Self-pay | Admitting: Neurology

## 2020-10-30 ENCOUNTER — Other Ambulatory Visit: Payer: Self-pay

## 2020-10-30 VITALS — BP 148/80 | HR 70

## 2020-10-30 DIAGNOSIS — I63411 Cerebral infarction due to embolism of right middle cerebral artery: Secondary | ICD-10-CM

## 2020-10-31 NOTE — Progress Notes (Signed)
Patient left before she was seen.

## 2021-03-20 ENCOUNTER — Other Ambulatory Visit: Payer: Self-pay | Admitting: Family Medicine

## 2021-04-30 ENCOUNTER — Encounter (HOSPITAL_BASED_OUTPATIENT_CLINIC_OR_DEPARTMENT_OTHER): Payer: Self-pay

## 2021-04-30 ENCOUNTER — Other Ambulatory Visit: Payer: Self-pay

## 2021-04-30 MED ORDER — APIXABAN 5 MG PO TABS: 5.0000 mg | ORAL_TABLET | Freq: Two times a day (BID) | ORAL | 1 refills | Status: DC

## 2021-04-30 NOTE — Telephone Encounter (Signed)
Prescription refill request for Eliquis received. Indication:AFIB Last office visit:CHRISTOPHER 05/22/20 Scr:0.8 08/17/20 Age: 62F Weight:88.7KG

## 2021-06-08 ENCOUNTER — Other Ambulatory Visit: Payer: Self-pay | Admitting: *Deleted

## 2021-06-08 DIAGNOSIS — I4892 Unspecified atrial flutter: Secondary | ICD-10-CM

## 2021-06-08 MED ORDER — DILTIAZEM HCL ER COATED BEADS 120 MG PO CP24: 120.0000 mg | ORAL_CAPSULE | Freq: Every day | ORAL | 1 refills | Status: DC

## 2021-08-14 ENCOUNTER — Ambulatory Visit (HOSPITAL_BASED_OUTPATIENT_CLINIC_OR_DEPARTMENT_OTHER): Payer: Managed Care, Other (non HMO) | Admitting: Cardiology

## 2021-09-14 ENCOUNTER — Other Ambulatory Visit: Payer: Self-pay

## 2021-09-14 ENCOUNTER — Ambulatory Visit (HOSPITAL_BASED_OUTPATIENT_CLINIC_OR_DEPARTMENT_OTHER): Payer: Managed Care, Other (non HMO) | Admitting: Cardiology

## 2021-09-14 ENCOUNTER — Encounter (HOSPITAL_BASED_OUTPATIENT_CLINIC_OR_DEPARTMENT_OTHER): Payer: Self-pay | Admitting: Cardiology

## 2021-09-14 VITALS — BP 140/80 | HR 69 | Ht 64.0 in | Wt 191.1 lb

## 2021-09-14 DIAGNOSIS — Z8673 Personal history of transient ischemic attack (TIA), and cerebral infarction without residual deficits: Secondary | ICD-10-CM

## 2021-09-14 DIAGNOSIS — R030 Elevated blood-pressure reading, without diagnosis of hypertension: Secondary | ICD-10-CM

## 2021-09-14 DIAGNOSIS — I4892 Unspecified atrial flutter: Secondary | ICD-10-CM

## 2021-09-14 DIAGNOSIS — Z7189 Other specified counseling: Secondary | ICD-10-CM | POA: Diagnosis not present

## 2021-09-14 NOTE — Patient Instructions (Signed)

## 2021-09-14 NOTE — Progress Notes (Signed)
Cardiology Office Note:    Date:  09/14/2021   ID:  Jane Sparks, DOB May 10, 1966, MRN [id number]  PCP:  Lattie Haw, MD  Cardiologist:  Buford Dresser, MD  Referring MD: Lattie Haw, MD   CC: follow up  History of Present Illness:    Jane Sparks is a 55 y.o. female with a hx of CVA, atrial flutter who is seen for follow up today. I initially met her in the hospital 03/23/20 when she had new onset atrial flutter.  On 08/17/2020 she presented to the ED following 2 neurologic events, one 20-30 min episode of slurred speech, and one later 20-30 min episode of left hand weakness. Both episodes resolved spontaneously. MRI revealed evidence of embolic stroke. There was an acute infarction in the right MCA.   Today: Overall, she is feeling good with no new complaints. She has not felt like she had any arrhythmias lately. The last time she felt this was when she took Wellbutrin, which caused her to develop a 4-day episode of arrhythmia. She does monitor her heart rate using her apple watch.  She confirms having a mini-stroke 08/2020. In her family, her mother had a stroke.  She endorses having higher blood pressures during clinic visits. She does not monitor her BP at home.  For activity she is walking regularly. She has "horrific" seasonal allergies, which contribute to shortness of breath that limits her activity. She becomes winded after climbing stairs. In the Spring and Summer she enjoys gardening.  After her episode of Afib in 03/2020, she successfully quit smoking.  In 07/2021 she was infected with COVID.  She denies any chest pain, or shortness of breath. No lightheadedness, headaches, syncope, orthopnea, PND, lower extremity edema or exertional symptoms. She denies any issues with easy bruising or bleeding.   Past Medical History:  Diagnosis Date   Alcohol use    Anxiety    Atrial fibrillation with RVR ([place])    Smoker    Stroke ([place]) 06/2020   TIA    History  reviewed. No pertinent surgical history.  Current Medications: Current Outpatient Medications on File Prior to Visit  Medication Sig   apixaban (ELIQUIS) 5 MG TABS tablet Take 1 tablet (5 mg total) by mouth 2 (two) times daily.   atorvastatin (LIPITOR) 40 MG tablet TAKE ONE TABLET BY MOUTH ONCE DAILY   calcium carbonate (TUMS - DOSED IN MG ELEMENTAL CALCIUM) 500 MG chewable tablet Chew 1 tablet by mouth daily as needed for indigestion or heartburn.   cetirizine (ZYRTEC) 10 MG tablet Take 10 mg by mouth daily.   cholecalciferol (VITAMIN D3) 25 MCG (1000 UNIT) tablet Take 1,000 Units by mouth daily.   diltiazem (CARDIZEM CD) 120 MG 24 hr capsule Take 1 capsule (120 mg total) by mouth daily.   diphenhydrAMINE (BENADRYL) 25 MG tablet Take 25 mg by mouth as needed for allergies.    famotidine (PEPCID) 20 MG tablet Take 20 mg by mouth as needed for heartburn or indigestion.    No current facility-administered medications on file prior to visit.     Allergies:   Penicillins and Sulfa antibiotics   Social History   Tobacco Use   Smoking status: Former    Types: Cigarettes    Quit date: 03/2020    Years since quitting: 1.5   Smokeless tobacco: Never   Tobacco comments:    stopped after hospital stay 03/24/2020  Substance Use Topics   Alcohol use: Yes    Comment: ocassionally   Drug  use: Not Currently    Family History: family history includes Atrial fibrillation in her mother; CVA in her mother; Heart disease in her father.  ROS:   Please see the history of present illness.   (+) Seasonal allergies (+) Shortness of breath Additional pertinent ROS otherwise unremarkable.  EKGs/Labs/Other Studies Reviewed:    The following studies were reviewed today:  MRA Head/Neck 08/17/2020: COMPARISON:  Head CT earlier same day   FINDINGS: MRI HEAD FINDINGS   Brain: Diffusion imaging shows areas of acute infarction in the right MCA territory. There is a focal 1 x 2 cm region of  infarction affecting the right posterior frontal cortex. There is a larger region of cortical acute infarction at the right temporoparietal junction. No evidence of swelling or hemorrhage. Otherwise, the brain appears normal without evidence of any older insult. No mass, hydrocephalus or extra-axial collection.   Vascular: Major vessels at the base of the brain show flow.   Skull and upper cervical spine: Negative   Sinuses/Orbits: Clear/normal   Other: None   MRA HEAD FINDINGS   Both internal carotid arteries are widely patent through the skull base and siphon regions. The anterior and middle cerebral vessels are patent without proximal stenosis, aneurysm or vascular malformation. No large or medium vessel occlusion can be demonstrated on the right. Both posterior cerebral arteries take fetal origin from the anterior circulation.   Both vertebral arteries are patent at the foramen magnum. The right terminates in PICA. The left supplies the small basilar artery. Basilar artery terminates or nearly terminates in the superior cerebellar arteries. PCAs receive there supply from the anterior circulation.   MRA NECK FINDINGS   Both common carotid arteries widely patent to the bifurcation. Both carotid bifurcations are normal. No stenosis or irregularity. Both cervical internal carotid arteries are widely patent.   Both vertebral arteries are patent. The origins are not well seen because of chest motion. Both vessels are patent through the neck to the foramen magnum.   IMPRESSION: 1. Acute infarction in the right MCA territory affecting the posterior frontal cortex and a separate region in the right temporoparietal junction region. No evidence of swelling or hemorrhage at this time. Findings are consistent with embolic disease to the right MCA territory. 2. No intracranial large or medium vessel occlusion or correctable proximal stenosis visible at this time. 3. No evidence  of neck vessel pathology.  Echo 03/23/20  1. Inferior basal hypokinesis . Left ventricular ejection fraction, by  estimation, is 55%. The left ventricle has normal function. The left  ventricle demonstrates regional wall motion abnormalities (see scoring  diagram/findings for description). Left  ventricular diastolic parameters are indeterminate.   2. Right ventricular systolic function is normal. The right ventricular  size is normal. There is normal pulmonary artery systolic pressure.   3. Left atrial size was moderately dilated.   4. The mitral valve is normal in structure. Mild mitral valve  regurgitation. No evidence of mitral stenosis.   5. The aortic valve is tricuspid. Aortic valve regurgitation is trivial.  Mild aortic valve sclerosis is present, with no evidence of aortic valve  stenosis.   6. The inferior vena cava is normal in size with greater than 50%  respiratory variability, suggesting right atrial pressure of 3 mmHg.  EKG:  EKG is personally reviewed.   09/14/2021: Sinus rhythm. Rate 69 bpm. 05/22/2020: sinus rhythm at 67 bpm.  Recent Labs: No results found for requested labs within last 8760 hours.   Recent Lipid  Panel    Component Value Date/Time   CHOL 148 08/17/2020 1015   TRIG 68 08/17/2020 1015   HDL 34 (L) 08/17/2020 1015   CHOLHDL 4.4 08/17/2020 1015   VLDL 14 08/17/2020 1015   LDLCALC 100 (H) 08/17/2020 1015    Physical Exam:    VS:  BP 140/80 (BP Location: Left Arm, Patient Position: Sitting, Cuff Size: Large)   Pulse 69   Ht '5\' 4"'  (1.626 m)   Wt 191 lb 1.6 oz (86.7 kg)   BMI 32.80 kg/m     Wt Readings from Last 3 Encounters:  09/14/21 191 lb 1.6 oz (86.7 kg)  09/26/20 195 lb 9.6 oz (88.7 kg)  08/23/20 195 lb 6.4 oz (88.6 kg)    GEN: Well nourished, well developed in no acute distress HEENT: Normal, moist mucous membranes NECK: No JVD CARDIAC: regular rhythm, normal S1 and S2, no rubs or gallops. No murmurs. VASCULAR: Radial and DP pulses  2+ bilaterally. No carotid bruits RESPIRATORY:  Clear to auscultation without rales, wheezing or rhonchi  ABDOMEN: Soft, non-tender, non-distended MUSCULOSKELETAL:  Ambulates independently SKIN: Warm and dry, no edema NEUROLOGIC:  Alert and oriented x 3. No focal neuro deficits noted. PSYCHIATRIC:  Normal affect    ASSESSMENT:    1. Paroxysmal atrial flutter (HCC)   2. Cardiac risk counseling   3. Counseling on health promotion and disease prevention   4. History of CVA (cerebrovascular accident)     PLAN:    Paroxysmal atrial flutter History of CVA -CHA2DS2/VAS Stroke Risk Points=3  -continue apixaban -tolerating diltiazem, continue  -discussed red flag warning signs, when to call the office and when to go to ER -now on statin, tolerating -quit smoking 03/2020 -ECG in sinus rhythm today  White coat syndrome -reports BP always higher in the office, normal when she has checked at home -follow  Cardiac risk counseling and prevention recommendations: -recommend heart healthy/Mediterranean diet, with whole grains, fruits, vegetable, fish, lean meats, nuts, and olive oil. Limit salt. -recommend moderate walking, 3-5 times/week for 30-50 minutes each session. Aim for at least 150 minutes.week. Goal should be pace of 3 miles/hours, or walking 1.5 miles in 30 minutes -recommend avoidance of tobacco products. Avoid excess alcohol.   Plan for follow up: 1 year or sooner as needed  Buford Dresser, MD, PhD [place]  [facility]    Medication Adjustments/Labs and Tests Ordered: Current medicines are reviewed at length with the patient today.  Concerns regarding medicines are outlined above.   Orders Placed This Encounter  Procedures   EKG 12-Lead    No orders of the defined types were placed in this encounter.  Patient Instructions  Medication Instructions:  Your Physician recommend you continue on your current medication as directed.    *If you need a refill  on your cardiac medications before your next appointment, please call your pharmacy*   Lab Work: None ordered today   Testing/Procedures: None ordered today   Follow-Up: At [facility], you and your health needs are our priority.  As part of our continuing mission to provide you with exceptional heart care, we have created designated Provider Care Teams.  These Care Teams include your primary Cardiologist (physician) and Advanced Practice Providers (APPs -  Physician Assistants and Nurse Practitioners) who all work together to provide you with the care you need, when you need it.  We recommend signing up for the patient portal called "MyChart".  Sign up information is provided on this After Visit  Summary.  MyChart is used to connect with patients for Virtual Visits (Telemedicine).  Patients are able to view lab/test results, encounter notes, upcoming appointments, etc.  Non-urgent messages can be sent to your provider as well.   To learn more about what you can do with MyChart, go to [email or web address].    Your next appointment:   1 year(s)  The format for your next appointment:   In Person  Provider:   Buford Dresser, MD       [facility],acting as a scribe for Buford Dresser, MD.,have documented all relevant documentation on the behalf of Buford Dresser, MD,as directed by  Buford Dresser, MD while in the presence of Buford Dresser, MD.  I, Buford Dresser, MD, have reviewed all documentation for this visit. The documentation on 09/14/21 for the exam, diagnosis, procedures, and orders are all accurate and complete.   Signed, Buford Dresser, MD PhD 09/14/2021   [facility]

## 2021-09-15 ENCOUNTER — Other Ambulatory Visit: Payer: Self-pay | Admitting: Family Medicine

## 2021-09-19 NOTE — Telephone Encounter (Signed)
No action required.

## 2021-09-26 ENCOUNTER — Ambulatory Visit: Payer: Managed Care, Other (non HMO) | Admitting: Family Medicine

## 2021-10-23 ENCOUNTER — Other Ambulatory Visit: Payer: Self-pay | Admitting: Cardiology

## 2021-10-23 DIAGNOSIS — I4891 Unspecified atrial fibrillation: Secondary | ICD-10-CM

## 2021-10-23 DIAGNOSIS — I4892 Unspecified atrial flutter: Secondary | ICD-10-CM

## 2021-10-23 NOTE — Telephone Encounter (Signed)
Eliquis refills

## 2021-10-24 NOTE — Telephone Encounter (Signed)
Prescription refill request for Eliquis received. Indication: a fib Last office visit: 09/14/2221 Scr: overdue Age: 56 Weight: 86kg

## 2021-10-24 NOTE — Telephone Encounter (Signed)
Eliquis refill  

## 2021-10-29 ENCOUNTER — Telehealth (HOSPITAL_BASED_OUTPATIENT_CLINIC_OR_DEPARTMENT_OTHER): Payer: Self-pay

## 2021-10-29 ENCOUNTER — Encounter (HOSPITAL_BASED_OUTPATIENT_CLINIC_OR_DEPARTMENT_OTHER): Payer: Self-pay

## 2021-10-29 NOTE — Telephone Encounter (Signed)
Called pharmacy for patient. Patient is in need of new prior auth. Pharmacy states the request has been sent electronically to office

## 2021-10-30 NOTE — Telephone Encounter (Signed)
It should already be at her pharmacy from a few days ago.  The pharmacy just needs to process it again now that the PA is complete

## 2021-10-30 NOTE — Telephone Encounter (Signed)
PA for Eliquis approved.    This request has been approved using information available on the patient's profile. [id number];Review Type:Prior Auth;Coverage Start Date:10/30/2021;Coverage End Date:10/30/2022;

## 2021-11-12 ENCOUNTER — Ambulatory Visit: Payer: Managed Care, Other (non HMO) | Admitting: Family Medicine

## 2021-11-12 NOTE — Telephone Encounter (Signed)
Do we have a PA for this patient?

## 2021-11-13 NOTE — Telephone Encounter (Signed)
Jane Sparks! Please see message below from patient. Not sure what is going on.

## 2022-01-25 ENCOUNTER — Other Ambulatory Visit: Payer: Self-pay

## 2022-01-25 ENCOUNTER — Emergency Department (HOSPITAL_BASED_OUTPATIENT_CLINIC_OR_DEPARTMENT_OTHER)
Admission: EM | Admit: 2022-01-25 | Discharge: 2022-01-25 | Disposition: A | Discharge status: Home / Self Care | Payer: Managed Care, Other (non HMO) | Attending: Emergency Medicine | Admitting: Emergency Medicine

## 2022-01-25 ENCOUNTER — Other Ambulatory Visit (HOSPITAL_BASED_OUTPATIENT_CLINIC_OR_DEPARTMENT_OTHER): Payer: Self-pay

## 2022-01-25 DIAGNOSIS — R21 Rash and other nonspecific skin eruption: Secondary | ICD-10-CM | POA: Insufficient documentation

## 2022-01-25 DIAGNOSIS — Z7901 Long term (current) use of anticoagulants: Secondary | ICD-10-CM | POA: Diagnosis not present

## 2022-01-25 MED ORDER — PREDNISONE 20 MG PO TABS
40.0000 mg | ORAL_TABLET | Freq: Every day | ORAL | 0 refills | Status: AC
  Filled 2022-01-25: qty 10, 5d supply, fill #0

## 2022-01-25 NOTE — ED Provider Notes (Signed)
?[facility] EMERGENCY DEPT ?Provider Note ? ? ?CSN: [id number] ?Arrival date & time: 01/25/22  0920 ? ?  ? ?History ? ?Chief Complaint  ?Patient presents with  ? Rash  ? ? ?Jane Sparks is a 56 y.o. female.  Presenting to the emergency department due to concern for rash.  Patient reports that over the last 4 days she has had progressive rash.  Started on her right cheek and then started on her left neck and now had a spot on her left shoulder and right neck.  Rash is itchy.  Somewhat uncomfortable but not particularly painful.  She states that she recently had a diarrheal illness but this seems to be resolving.  She did not have associated abdominal pain or vomiting.  Has been able to tolerate p.o. without any difficulty.  No fevers, no difficulty breathing.  No eye pain or eye discomfort.  She has worked outside in the yard quite a bit lately but she denies any known exposures to poison ivy or poison oak. ? ?HPI ? ?  ? ?Home Medications ?Prior to Admission medications   ?Medication Sig Start Date End Date Taking? Authorizing Provider  ?predniSONE (DELTASONE) 20 MG tablet Take 2 tablets (40 mg total) by mouth daily for 5 days. 01/25/22 01/30/22 Yes Dangela How, Quitman Livings, MD  ?atorvastatin (LIPITOR) 40 MG tablet TAKE 1 TABLET BY MOUTH EVERY DAY 09/18/21   Westley Chandler, MD  ?calcium carbonate (TUMS - DOSED IN MG ELEMENTAL CALCIUM) 500 MG chewable tablet Chew 1 tablet by mouth daily as needed for indigestion or heartburn.    [provider]  ?cetirizine (ZYRTEC) 10 MG tablet Take 10 mg by mouth daily.    [provider]  ?cholecalciferol (VITAMIN D3) 25 MCG (1000 UNIT) tablet Take 1,000 Units by mouth daily.    [provider]  ?diltiazem (CARDIZEM CD) 120 MG 24 hr capsule Take 1 capsule (120 mg total) by mouth daily. 06/08/21   Towanda Octave, MD  ?diphenhydrAMINE (BENADRYL) 25 MG tablet Take 25 mg by mouth as needed for allergies.     [provider]  ?ELIQUIS 5 MG TABS  tablet TAKE 1 TABLET BY MOUTH TWICE A DAY 10/24/21   Jodelle Red, MD  ?famotidine (PEPCID) 20 MG tablet Take 20 mg by mouth as needed for heartburn or indigestion.     [provider]  ?   ? ?Allergies    ?Penicillins and Sulfa antibiotics   ? ?Review of Systems   ?Review of Systems  ?Constitutional:  Negative for chills, fatigue and fever.  ?HENT:  Negative for ear pain and sore throat.   ?Eyes:  Negative for pain and visual disturbance.  ?Respiratory:  Negative for cough and shortness of breath.   ?Cardiovascular:  Negative for chest pain and palpitations.  ?Gastrointestinal:  Positive for diarrhea. Negative for abdominal pain and vomiting.  ?Genitourinary:  Negative for dysuria and hematuria.  ?Musculoskeletal:  Negative for arthralgias and back pain.  ?Skin:  Positive for rash. Negative for color change.  ?Neurological:  Negative for seizures and syncope.  ?All other systems reviewed and are negative. ? ?Physical Exam ?Updated Vital Signs ?BP 135/67 (BP Location: Left Arm)   Pulse 65   Temp (!) 97 ?F (36.1 ?C)   Resp 18   Ht 5\' 4"  (1.626 m)   Wt 84.4 kg   SpO2 100%   BMI 31.93 kg/m?  ?Physical Exam ?Vitals and nursing note reviewed.  ?Constitutional:   ?   General: She  is not in acute distress. ?   Appearance: She is well-developed.  ?HENT:  ?   Head: Normocephalic and atraumatic.  ?Eyes:  ?   Conjunctiva/sclera: Conjunctivae normal.  ?Cardiovascular:  ?   Rate and Rhythm: Normal rate and regular rhythm.  ?   Heart sounds: No murmur heard. ?Pulmonary:  ?   Effort: Pulmonary effort is normal. No respiratory distress.  ?   Breath sounds: Normal breath sounds.  ?Abdominal:  ?   Palpations: Abdomen is soft.  ?   Tenderness: There is no abdominal tenderness.  ?Musculoskeletal:     ?   General: No swelling.  ?   Cervical back: Neck supple.  ?Skin: ?   Capillary Refill: Capillary refill takes less than 2 seconds.  ?   Comments: Blanchable erythematous rash to right cheek around 2 cm in  diameter, there is a similar blanchable slightly raised rash to the left neck also about 2 to 3 cm in diameter; no warmth to touch; right eye appears uninvolved, normal EOM, conjunctiva normal, sclera normal  ?Neurological:  ?   General: No focal deficit present.  ?   Mental Status: She is alert.  ?Psychiatric:     ?   Mood and Affect: Mood normal.  ? ? ?ED Results / Procedures / Treatments   ?Labs ?(all labs ordered are listed, but only abnormal results are displayed) ?Labs Reviewed - No data to display ? ?EKG ?None ? ?Radiology ?No results found. ? ?Procedures ?Procedures  ? ? ?Medications Ordered in ED ?Medications - No data to display ? ?ED Course/ Medical Decision Making/ A&P ?  ?                        ?Medical Decision Making ?Risk ?Prescription drug management. ? ? ?56 year old lady presenting to the emergency department due to concern for rash to her face and neck.  On physical exam she does have a blanchable, slightly raised erythematous rash to right face, left neck.  Given pattern, no dermatomal distribution, doubt zoster.  Given itchiness, physical appearance, suspect more likely allergic reaction, hives.  No fevers, no warmth, given pattern, lower suspicion for cellulitis at this time.  Recommend trial of steroids with as needed Benadryl for itching.  Recommend recheck with PCP or return to ER if symptoms worsening.  Reviewed return precautions and discharged patient. ? ? ? ?After the discussed management above, the patient was determined to be safe for discharge.  The patient was in agreement with this plan and all questions regarding their care were answered.  ED return precautions were discussed and the patient will return to the ED with any significant worsening of condition. ? ? ? ? ? ? ? ? ?Final Clinical Impression(s) / ED Diagnoses ?Final diagnoses:  ?Rash  ? ? ?Rx / DC Orders ?ED Discharge Orders   ? ?      Ordered  ?  predniSONE (DELTASONE) 20 MG tablet  Daily       ? 01/25/22 0958  ? ?  ?   ? ?  ? ? ?  ?Milagros Loll, MD ?01/25/22 1001 ? ?

## 2022-01-25 NOTE — ED Triage Notes (Signed)
C/o rash on right cheek and left lower neck x 4 days;  ?

## 2022-01-25 NOTE — Discharge Instructions (Addendum)
Recommend following up with your primary care doctor for recheck in a couple days.  If you feel your symptoms are worsening over the next couple days despite these new medications, or if you develop fever, difficulty breathing, abdominal pain, vomiting or other new concerning symptom, please come back to the emergency room for reassessment. ? ?Recommend taking the prescribed steroids as well as Benadryl. ?

## 2022-02-05 IMAGING — DX DG CHEST 2V
2 series · 2 of 2 positions shown · non-contrast
Comparison: None.

CLINICAL DATA: Atrial fibrillation, smoker in asthma.

EXAM:
CHEST - 2 VIEW

[w chest pa]
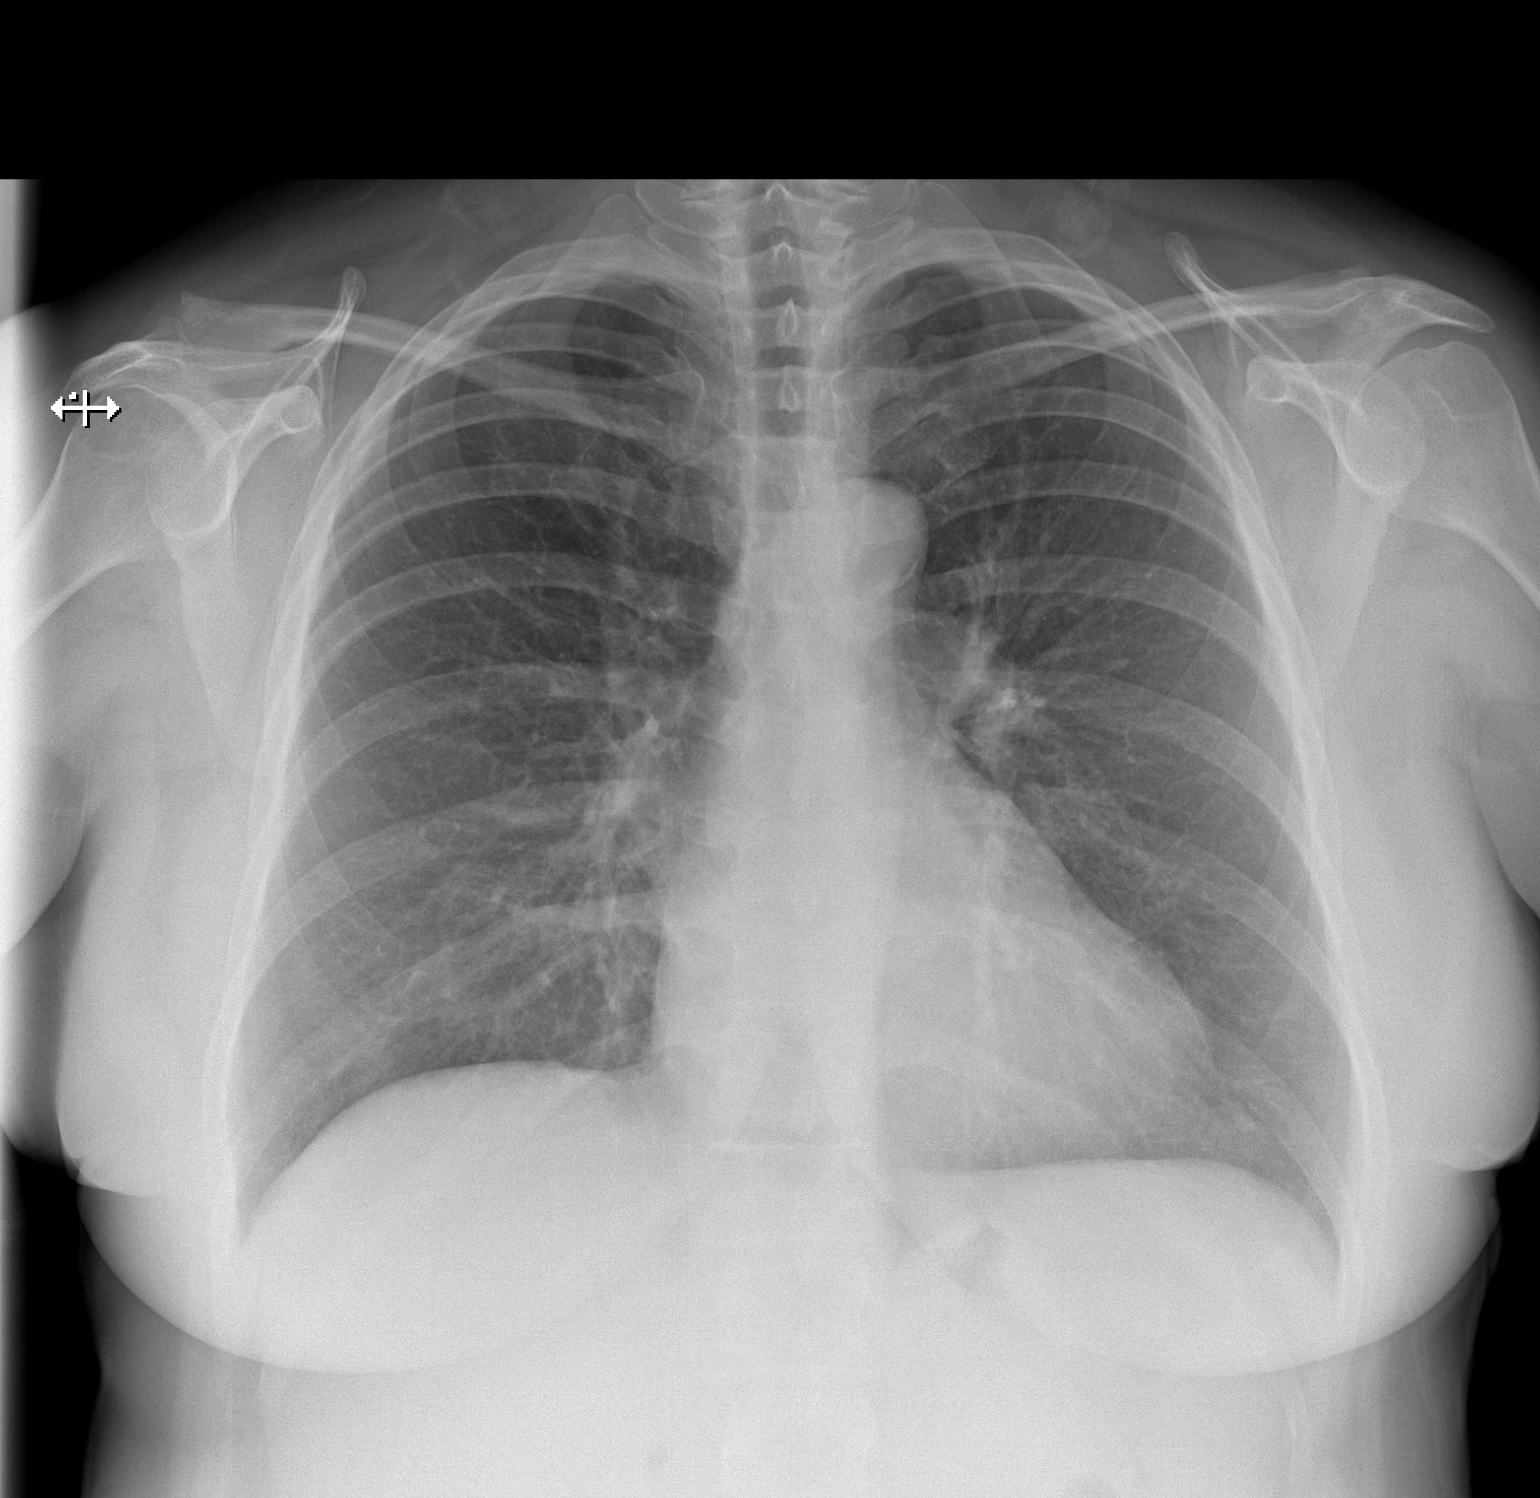

[w chest lat]
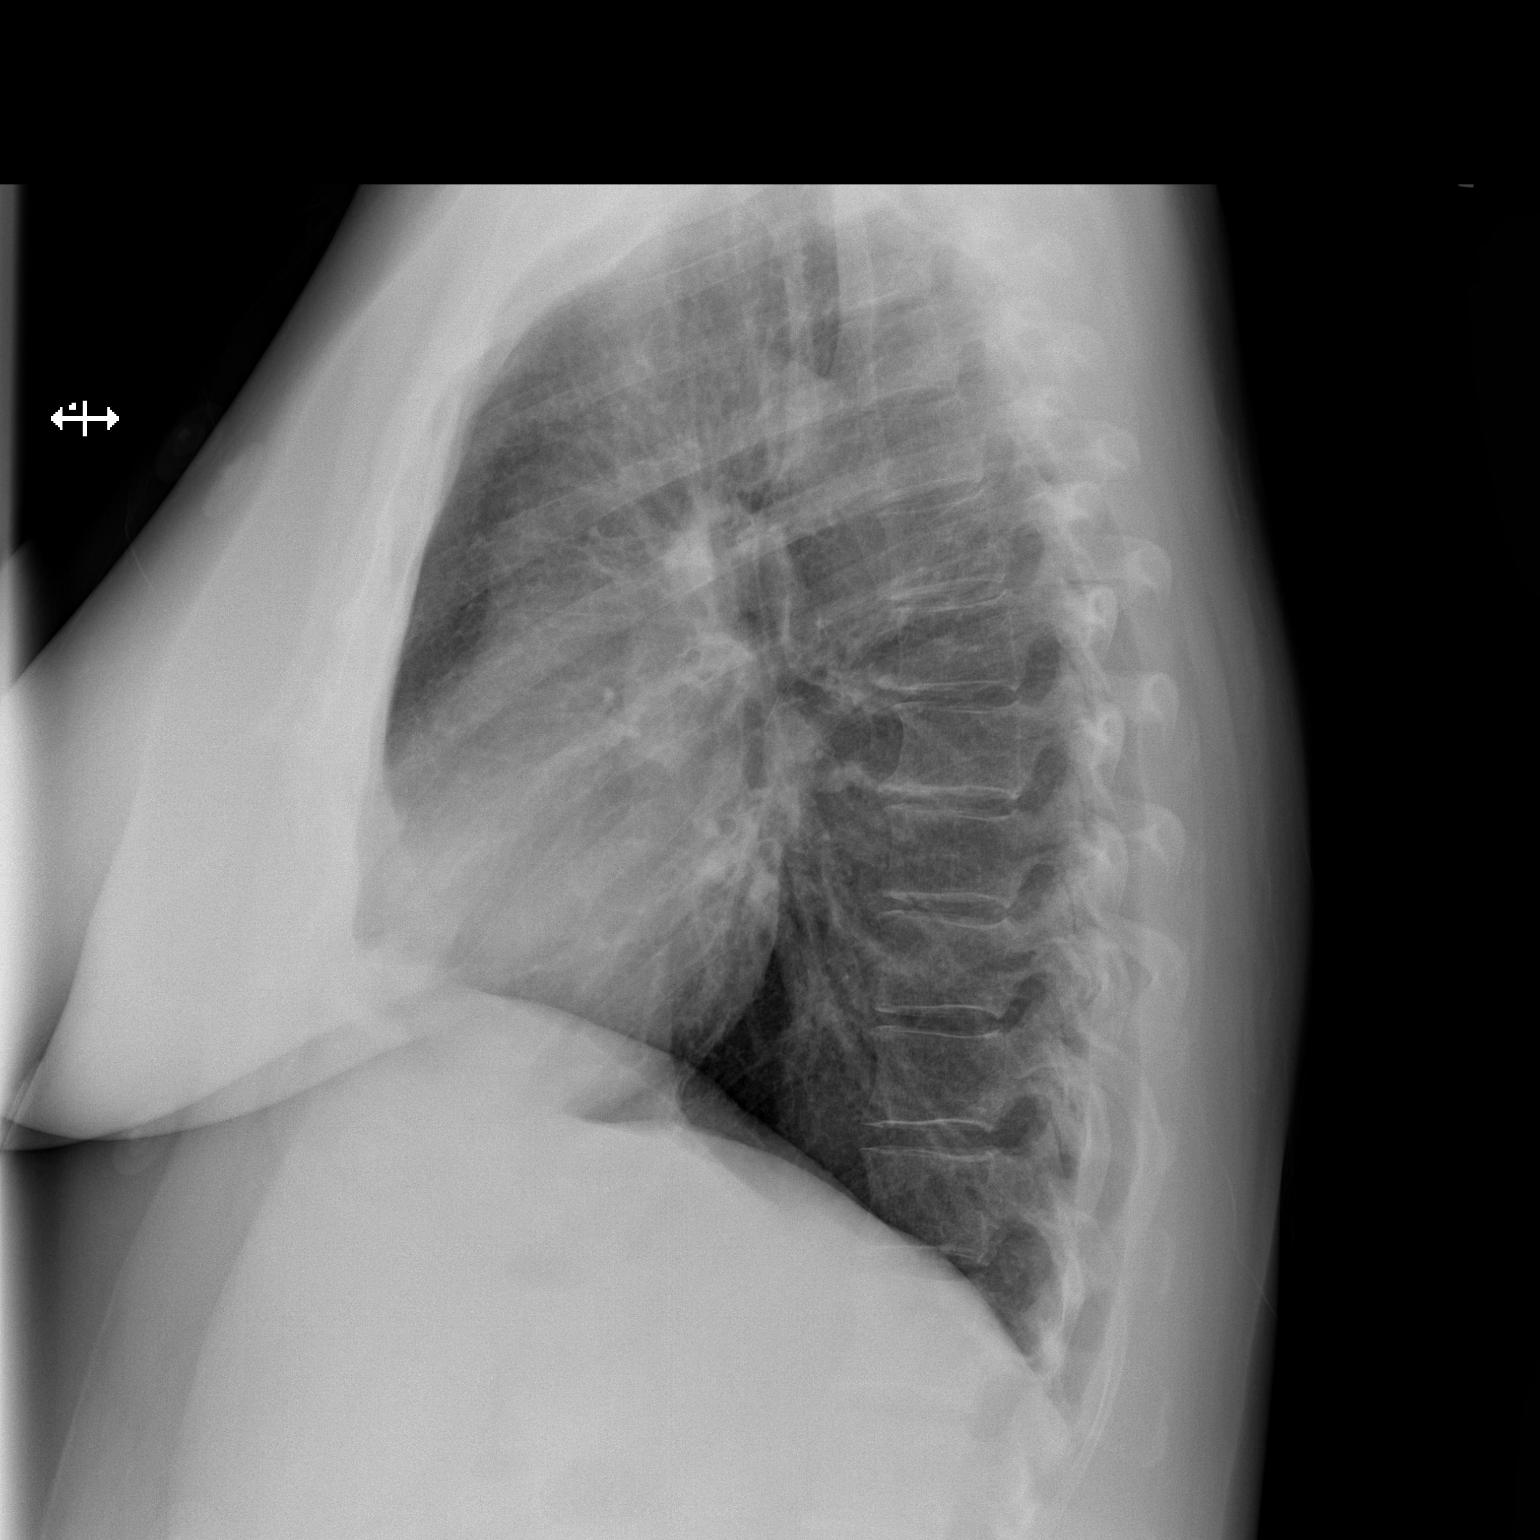

[2 of 2 positions shown; findings below may reference images not displayed]

FINDINGS: The heart size and mediastinal contours are within normal limits.
The lungs are clear. No pneumothorax or pleural effusion. The
visualized skeletal structures are unremarkable.
IMPRESSION: No active cardiopulmonary disease.

## 2022-02-06 ENCOUNTER — Encounter (HOSPITAL_BASED_OUTPATIENT_CLINIC_OR_DEPARTMENT_OTHER): Payer: Self-pay

## 2022-02-06 ENCOUNTER — Telehealth (HOSPITAL_BASED_OUTPATIENT_CLINIC_OR_DEPARTMENT_OTHER): Payer: Self-pay | Admitting: *Deleted

## 2022-02-06 NOTE — Telephone Encounter (Signed)
Rash on face and neck few weeks ago, steroids given  ?After finished mouth started hurting, went to dentist and is actually having a wisdom tooth come in. Antibiotics given, needs to be removed. Took 3 days and heart started to flutter, didn't finish antibiotics  ?Feels shaky, lightheaded, and unable to focus  ?Apple watch HR going all over the place, only over 100 when exerting herself  ?Taking her Eliquis and diltiazem as directed ?Patient is going to try to upload strips and send via mychart  ?

## 2022-02-06 NOTE — Telephone Encounter (Signed)
Patient has been scheduled to see Afib clinic tomorrow  ?Will call her this afternoon to see if still in SR  ?

## 2022-02-06 NOTE — Telephone Encounter (Signed)
See telephone encounter 02/06/2022 ?

## 2022-02-06 NOTE — Telephone Encounter (Signed)
Called to follow up  ?Patient checked Apple watch, in AFib so she will keep her appointment with Elvina Sidle NP Afib clinic as scheduled  ?

## 2022-02-07 ENCOUNTER — Encounter (HOSPITAL_COMMUNITY): Payer: Self-pay | Admitting: Nurse Practitioner

## 2022-02-07 ENCOUNTER — Ambulatory Visit (HOSPITAL_COMMUNITY)
Admission: RE | Admit: 2022-02-07 | Discharge: 2022-02-07 | Disposition: A | Discharge status: Home / Self Care | Payer: Managed Care, Other (non HMO) | Source: Ambulatory Visit | Attending: Nurse Practitioner | Admitting: Nurse Practitioner

## 2022-02-07 VITALS — BP 126/80 | HR 86 | Ht 64.0 in | Wt 189.2 lb

## 2022-02-07 DIAGNOSIS — Z7901 Long term (current) use of anticoagulants: Secondary | ICD-10-CM | POA: Insufficient documentation

## 2022-02-07 DIAGNOSIS — I4892 Unspecified atrial flutter: Secondary | ICD-10-CM | POA: Insufficient documentation

## 2022-02-07 DIAGNOSIS — I480 Paroxysmal atrial fibrillation: Secondary | ICD-10-CM | POA: Insufficient documentation

## 2022-02-07 DIAGNOSIS — D6869 Other thrombophilia: Secondary | ICD-10-CM | POA: Diagnosis not present

## 2022-02-07 MED ORDER — DILTIAZEM HCL ER COATED BEADS 180 MG PO CP24: 180.0000 mg | ORAL_CAPSULE | Freq: Every day | ORAL | 1 refills | Status: DC

## 2022-02-07 NOTE — Progress Notes (Signed)
? ?Primary Care Physician: Lattie Haw, MD ?Referring Physician: Dr. Buford Dresser  ? ? ?Jane Sparks is a 56 y.o. female with a h/o new onset afib that pt was notified by her apple watch that she was in afib. She was treated at [facility] 6/16 thru 6/17. She  was started on daily Cardizem and eliquis for a CHA2DS2VASc of 1. She was d/c in afib but  presents today in SR. She  is very anxious about being here.Discussed triggers for afib. She denies snoring, 2 cups of coffee a day and drinks at least 2 drinks of alcohol a night. When mentioned alcohol can be a trigger, she said that she was not cutting back on her alcohol. She states that she is being compliant with cardizem and eliqius. She has had covid vaccines. ? ?F/u in afib clinic, 02/07/22. I have not seen for 2 years. Afib has been quiet.  She has had  rhythm issues, had  prednisone use for rash on neck and face off several days before having afib and then started clindamycin for a wisdom tooth issue causing pain and requested an appointment. EKG shows typical atrial flutter at 86 bpm. She remains on eliquis 5 mg bid for a CHA2DS2VASc  score of 4. She has had some SR intermittently since being out of rhythm as reviewed on apple watch. ? ?Today, she denies symptoms of palpitations, chest pain, shortness of breath, orthopnea, PND, lower extremity edema, dizziness, presyncope, syncope, or neurologic sequela. The patient is tolerating medications without difficulties and is otherwise without complaint today.  ? ?Past Medical History:  ?Diagnosis Date  ? Alcohol use   ? Anxiety   ? Atrial fibrillation with RVR ([place])   ? Smoker   ? Stroke [facility]) 06/2020  ? TIA  ? ?No past surgical history on file. ? ?Current Outpatient Medications  ?Medication Sig Dispense Refill  ? ALREX 0.2 % SUSP Apply to eye as needed.    ? atorvastatin (LIPITOR) 40 MG tablet TAKE 1 TABLET BY MOUTH EVERY DAY 90 tablet 1  ? calcium carbonate (TUMS - DOSED IN MG ELEMENTAL CALCIUM) 500 MG  chewable tablet Chew 1 tablet by mouth daily as needed for indigestion or heartburn.    ? cetirizine (ZYRTEC) 10 MG tablet Take 10 mg by mouth daily.    ? diltiazem (CARDIZEM CD) 120 MG 24 hr capsule Take 1 capsule (120 mg total) by mouth daily. 90 capsule 1  ? diphenhydrAMINE (BENADRYL) 25 MG tablet Take 25 mg by mouth as needed for allergies.     ? ELIQUIS 5 MG TABS tablet TAKE 1 TABLET BY MOUTH TWICE A DAY 180 tablet 1  ? famotidine (PEPCID) 20 MG tablet Take 20 mg by mouth as needed for heartburn or indigestion.     ? loteprednol (LOTEMAX) 0.5 % ophthalmic suspension Place into both eyes as needed.    ? SPIRULINA PO Taking 3 tablets by mouth in the am and 3 tablets in the pm- 3000mg  total    ? ?No current facility-administered medications for this encounter.  ? ? ?Allergies  ?Allergen Reactions  ? Penicillins   ? Sulfa Antibiotics   ? ? ?Social History  ? ?Socioeconomic History  ? Marital status: Married  ?  Spouse name: Nadara Mustard  ? Number of children: Not on file  ? Years of education: Not on file  ? Highest education level: Not on file  ?Occupational History  ? Occupation: full time  ?Tobacco Use  ? Smoking status: Former  ?  Types: Cigarettes  ?  Quit date: 03/2020  ?  Years since quitting: 1.9  ? Smokeless tobacco: Never  ? Tobacco comments:  ?  stopped after hospital stay 03/24/2020  ?Substance and Sexual Activity  ? Alcohol use: Yes  ?  Comment: ocassionally  ? Drug use: Not Currently  ? Sexual activity: Not on file  ?Other Topics Concern  ? Not on file  ?Social History Narrative  ? Lives with Husband Nadara Mustard  ? Right Handed  ? Drinks 1-3 cups caffeine daily  ? ?Social Determinants of Health  ? ?Financial Resource Strain: Not on file  ?Food Insecurity: Not on file  ?Transportation Needs: Not on file  ?Physical Activity: Not on file  ?Stress: Not on file  ?Social Connections: Not on file  ?Intimate Partner Violence: Not on file  ? ? ?Family History  ?Problem Relation Age of Onset  ? Atrial fibrillation Mother    ? CVA Mother   ? Heart disease Father   ? ? ?ROS- All systems are reviewed and negative except as per the HPI above ? ?Physical Exam: ?Vitals:  ? 02/07/22 1031  ?Weight: 85.8 kg  ?Height: 5\' 4"  (1.626 m)  ? ?Wt Readings from Last 3 Encounters:  ?02/07/22 85.8 kg  ?01/25/22 84.4 kg  ?09/14/21 86.7 kg  ? ? ?Labs: ?Lab Results  ?Component Value Date  ? NA 142 08/17/2020  ? K 3.9 08/17/2020  ? CL 107 08/17/2020  ? CO2 23 08/17/2020  ? GLUCOSE 93 08/17/2020  ? BUN 15 08/17/2020  ? CREATININE 0.80 08/17/2020  ? CALCIUM 9.2 08/17/2020  ? MG 1.8 03/23/2020  ? ?Lab Results  ?Component Value Date  ? INR 1.5 (H) 08/17/2020  ? ?Lab Results  ?Component Value Date  ? CHOL 148 08/17/2020  ? HDL 34 (L) 08/17/2020  ? LDLCALC 100 (H) 08/17/2020  ? TRIG 68 08/17/2020  ? ? ? ?GEN- The patient is well appearing, alert and oriented x 3 today.   ?Head- normocephalic, atraumatic ?Eyes-  Sclera clear, conjunctiva pink ?Ears- hearing intact ?Oropharynx- clear ?Neck- supple, no JVP ?Lymph- no cervical lymphadenopathy ?Lungs- Clear to ausculation bilaterally, normal work of breathing ?Heart- Regular rate and rhythm, no murmurs, rubs or gallops, PMI not laterally displaced ?GI- soft, NT, ND, + BS ?Extremities- no clubbing, cyanosis, or edema ?MS- no significant deformity or atrophy ?Skin- no rash or lesion ?Psych- euthymic mood, full affect ?Neuro- strength and sensation are intact ? ?EKG- Vent. rate 86 BPM ?PR interval * ms ?QRS duration 68 ms ?QT/QTcB 334/399 ms ?P-R-T axes 80 63 74 ?Atrial flutter with 4:1 A-V conduction ?Abnormal ECG ?When compared with ECG of 17-Aug-2020 07:35, ?PREVIOUS ECG IS PRESENT ? ? ? 1. Inferior basal hypokinesis . Left ventricular ejection fraction, by  ?estimation, is 55%. The left ventricle has normal function. The left  ?ventricle demonstrates regional wall motion abnormalities (see scoring  ?diagram/findings for description). Left  ?ventricular diastolic parameters are indeterminate.  ? 2. Right ventricular  systolic function is normal. The right ventricular  ?size is normal. There is normal pulmonary artery systolic pressure.  ? 3. Left atrial size was moderately dilated.  ? 4. The mitral valve is normal in structure. Mild mitral valve  ?regurgitation. No evidence of mitral stenosis.  ? 5. The aortic valve is tricuspid. Aortic valve regurgitation is trivial.  ?Mild aortic valve sclerosis is present, with no evidence of aortic valve  ?stenosis.  ? 6. The inferior vena cava is normal in size with greater than  50%  ?respiratory variability, suggesting right atrial pressure of 3 mmHg.  ? ? ?Assessment and Plan: ?1. Paroxysmal atrial fib/flutter ? Will increase cardizem to 180 mg daily  ?May have be induced by recent prednisone/antibiotics for rash/tooth infection  ?Continue to monitor per apple watch  ? ?2. CHA2DS2VASc score of 4 ?Continue eliquis 5 mg bid  ? ?F/u here in one week  ? ? ?Geroge Baseman Kayleen Memos, ANP-C ?Afib Clinic ?[facility] ?[address] ?[place], [place] [address] ?[phone] ? ? ?

## 2022-02-07 NOTE — Patient Instructions (Signed)
Increase cardizem to 180mg once a day 

## 2022-02-14 ENCOUNTER — Encounter (HOSPITAL_COMMUNITY): Payer: Self-pay | Admitting: Nurse Practitioner

## 2022-02-14 ENCOUNTER — Ambulatory Visit (HOSPITAL_COMMUNITY)
Admission: RE | Admit: 2022-02-14 | Discharge: 2022-02-14 | Disposition: A | Discharge status: Home / Self Care | Payer: Managed Care, Other (non HMO) | Source: Ambulatory Visit | Attending: Nurse Practitioner | Admitting: Nurse Practitioner

## 2022-02-14 VITALS — BP 138/88 | HR 68 | Ht 64.0 in | Wt 189.0 lb

## 2022-02-14 DIAGNOSIS — I4892 Unspecified atrial flutter: Secondary | ICD-10-CM | POA: Diagnosis not present

## 2022-02-14 DIAGNOSIS — I480 Paroxysmal atrial fibrillation: Secondary | ICD-10-CM | POA: Diagnosis present

## 2022-02-14 DIAGNOSIS — Z7901 Long term (current) use of anticoagulants: Secondary | ICD-10-CM | POA: Diagnosis not present

## 2022-02-14 NOTE — Progress Notes (Signed)
? ?Primary Care Physician: Towanda Octave, MD ?Referring Physician: Dr. Jodelle Red  ? ? ?Jane Sparks is a 56 y.o. female with a h/o new onset afib that pt was notified by her apple watch that she was in afib. She was treated at [facility] 6/16 thru 6/17. She  was started on daily Cardizem and eliquis for a CHA2DS2VASc of 1. She was d/c in afib but  presents today in SR. She  is very anxious about being here.Discussed triggers for afib. She denies snoring, 2 cups of coffee a day and drinks at least 2 drinks of alcohol a night. When mentioned alcohol can be a trigger, she said that she was not cutting back on her alcohol. She states that she is being compliant with cardizem and eliqius. She has had covid vaccines. ? ?F/u in afib clinic, 02/07/22. I have not seen for 2 years. Afib has been quiet.  She has had  rhythm issues, had  prednisone use for rash on neck and face off several days before having afib and then started clindamycin for a wisdom tooth issue causing pain and requested an appointment. EKG shows typical atrial flutter at 86 bpm. She remains on eliquis 5 mg bid for a CHA2DS2VASc  score of 4. She has had some SR intermittently since being out of rhythm as reviewed on apple watch. ? ?F/u in afib clinic, 5/11, she is back in SR with the extra Cardizem but had to go back to 120 mg daily as it made her head feel very full.  ? ?Today, she denies symptoms of palpitations, chest pain, shortness of breath, orthopnea, PND, lower extremity edema, dizziness, presyncope, syncope, or neurologic sequela. The patient is tolerating medications without difficulties and is otherwise without complaint today.  ? ?Past Medical History:  ?Diagnosis Date  ? Alcohol use   ? Anxiety   ? Atrial fibrillation with RVR ([facility])   ? Smoker   ? Stroke [facility]) 06/2020  ? TIA  ? ?No past surgical history on file. ? ?Current Outpatient Medications  ?Medication Sig Dispense Refill  ? ALREX 0.2 % SUSP Apply to eye as needed.    ?  atorvastatin (LIPITOR) 40 MG tablet TAKE 1 TABLET BY MOUTH EVERY DAY 90 tablet 1  ? calcium carbonate (TUMS - DOSED IN MG ELEMENTAL CALCIUM) 500 MG chewable tablet Chew 1 tablet by mouth daily as needed for indigestion or heartburn.    ? cetirizine (ZYRTEC) 10 MG tablet Take 10 mg by mouth daily.    ? diltiazem (CARDIZEM CD) 180 MG 24 hr capsule Take 1 capsule (180 mg total) by mouth daily. 30 capsule 1  ? diphenhydrAMINE (BENADRYL) 25 MG tablet Take 25 mg by mouth as needed for allergies.     ? ELIQUIS 5 MG TABS tablet TAKE 1 TABLET BY MOUTH TWICE A DAY 180 tablet 1  ? famotidine (PEPCID) 20 MG tablet Take 20 mg by mouth as needed for heartburn or indigestion.     ? loteprednol (LOTEMAX) 0.5 % ophthalmic suspension Place into both eyes as needed.    ? SPIRULINA PO Taking 3 tablets by mouth in the am and 3 tablets in the pm- 3000mg  total    ? ?No current facility-administered medications for this encounter.  ? ? ?Allergies  ?Allergen Reactions  ? Penicillins   ? Sulfa Antibiotics   ? ? ?Social History  ? ?Socioeconomic History  ? Marital status: Married  ?  Spouse name:  ? Number of children: Not on  file  ? Years of education: Not on file  ? Highest education level: Not on file  ?Occupational History  ? Occupation: full time  ?Tobacco Use  ? Smoking status: Former  ?  Types: Cigarettes  ?  Quit date: 03/2020  ?  Years since quitting: 1.9  ? Smokeless tobacco: Never  ? Tobacco comments:  ?  Former smoker stopped after hospital stay 03/24/2020  ?Substance and Sexual Activity  ? Alcohol use: Yes  ?  Alcohol/week: 7.0 standard drinks  ?  Types: 7 Standard drinks or equivalent per week  ?  Comment: 1 mix drink daily 02/14/22  ? Drug use: Not Currently  ? Sexual activity: Not on file  ?Other Topics Concern  ? Not on file  ?Social History Narrative  ? Lives with Husband Dimas Aguas  ? Right Handed  ? Drinks 1-3 cups caffeine daily  ? ?Social Determinants of Health  ? ?Financial Resource Strain: Not on file  ?Food  Insecurity: Not on file  ?Transportation Needs: Not on file  ?Physical Activity: Not on file  ?Stress: Not on file  ?Social Connections: Not on file  ?Intimate Partner Violence: Not on file  ? ? ?Family History  ?Problem Relation Age of Onset  ? Atrial fibrillation Mother   ? CVA Mother   ? Heart disease Father   ? ? ?ROS- All systems are reviewed and negative except as per the HPI above ? ?Physical Exam: ?Vitals:  ? 02/14/22 1041  ?BP: 138/88  ?Pulse: 68  ?Weight: 85.7 kg  ?Height: 5\' 4"  (1.626 m)  ? ?Wt Readings from Last 3 Encounters:  ?02/14/22 85.7 kg  ?02/07/22 85.8 kg  ?01/25/22 84.4 kg  ? ? ?Labs: ?Lab Results  ?Component Value Date  ? NA 142 08/17/2020  ? K 3.9 08/17/2020  ? CL 107 08/17/2020  ? CO2 23 08/17/2020  ? GLUCOSE 93 08/17/2020  ? BUN 15 08/17/2020  ? CREATININE 0.80 08/17/2020  ? CALCIUM 9.2 08/17/2020  ? MG 1.8 03/23/2020  ? ?Lab Results  ?Component Value Date  ? INR 1.5 (H) 08/17/2020  ? ?Lab Results  ?Component Value Date  ? CHOL 148 08/17/2020  ? HDL 34 (L) 08/17/2020  ? LDLCALC 100 (H) 08/17/2020  ? TRIG 68 08/17/2020  ? ? ? ?GEN- The patient is well appearing, alert and oriented x 3 today.   ?Head- normocephalic, atraumatic ?Eyes-  Sclera clear, conjunctiva pink ?Ears- hearing intact ?Oropharynx- clear ?Neck- supple, no JVP ?Lymph- no cervical lymphadenopathy ?Lungs- Clear to ausculation bilaterally, normal work of breathing ?Heart- Regular rate and rhythm, no murmurs, rubs or gallops, PMI not laterally displaced ?GI- soft, NT, ND, + BS ?Extremities- no clubbing, cyanosis, or edema ?MS- no significant deformity or atrophy ?Skin- no rash or lesion ?Psych- euthymic mood, full affect ?Neuro- strength and sensation are intact ? ?EKG- Vent. rate 68 BPM ?PR interval 162 ms ?QRS duration 68 ms ?QT/QTcB 402/427 ms ?P-R-T axes 62 56 44 ?Normal sinus rhythm ?Low voltage QRS ?Borderline ECG ?When compared with ECG of 07-Feb-2022 10:49, ?PREVIOUS ECG IS PRESENT ? ? ? 1. Inferior basal hypokinesis .  Left ventricular ejection fraction, by  ?estimation, is 55%. The left ventricle has normal function. The left  ?ventricle demonstrates regional wall motion abnormalities (see scoring  ?diagram/findings for description). Left  ?ventricular diastolic parameters are indeterminate.  ? 2. Right ventricular systolic function is normal. The right ventricular  ?size is normal. There is normal pulmonary artery systolic pressure.  ? 3. Left atrial  size was moderately dilated.  ? 4. The mitral valve is normal in structure. Mild mitral valve  ?regurgitation. No evidence of mitral stenosis.  ? 5. The aortic valve is tricuspid. Aortic valve regurgitation is trivial.  ?Mild aortic valve sclerosis is present, with no evidence of aortic valve  ?stenosis.  ? 6. The inferior vena cava is normal in size with greater than 50%  ?respiratory variability, suggesting right atrial pressure of 3 mmHg.  ? ? ?Assessment and Plan: ?1. Paroxysmal atrial fib/flutter ?Increased  cardizem to 180 mg daily and pt returned to SR but could not tolerate for head feeling full and went back to 120 mg daily  ?She is very sensitive to meds and does not tolerate a lot of meds, so I worry about tolerance of antiarrythmic's ?We discussed next steps if arrhythmia returns ?For the most part, on review of ekg's, I see typical atrial flutter with either atypical atrial flutter or coarse afib in June 2012 ?We discussed front line ablation if arrhythmia increases in burden ?For now pt wants a wait and see approach  ?Continue to monitor per apple watch  ? ?2. CHA2DS2VASc score of 4 ?Continue eliquis 5 mg bid  ? ?F/u with Dr. Cristal Deerhristopher as scheduled, afib clinic as needed  ? ? ?Elvina Sidleonna C. Noralyn Pickarroll, ANP-C ?Afib Clinic ?[facility] ?[address] ?[place], [place] [address] ?[phone] ? ? ?

## 2022-02-26 ENCOUNTER — Other Ambulatory Visit: Payer: Self-pay | Admitting: Family Medicine

## 2022-02-26 DIAGNOSIS — I4892 Unspecified atrial flutter: Secondary | ICD-10-CM

## 2022-03-08 ENCOUNTER — Ambulatory Visit: Payer: Managed Care, Other (non HMO) | Admitting: Family Medicine

## 2022-03-12 ENCOUNTER — Encounter: Payer: Self-pay | Admitting: *Deleted

## 2022-03-25 ENCOUNTER — Other Ambulatory Visit: Payer: Self-pay | Admitting: *Deleted

## 2022-03-25 MED ORDER — ATORVASTATIN CALCIUM 40 MG PO TABS: 40.0000 mg | ORAL_TABLET | Freq: Every day | ORAL | 1 refills | Status: DC

## 2022-03-26 ENCOUNTER — Encounter (HOSPITAL_BASED_OUTPATIENT_CLINIC_OR_DEPARTMENT_OTHER): Payer: Self-pay

## 2022-04-09 ENCOUNTER — Other Ambulatory Visit (HOSPITAL_COMMUNITY): Payer: Self-pay | Admitting: Nurse Practitioner

## 2022-04-09 DIAGNOSIS — I4892 Unspecified atrial flutter: Secondary | ICD-10-CM

## 2022-05-01 ENCOUNTER — Other Ambulatory Visit: Payer: Self-pay | Admitting: Cardiology

## 2022-05-01 ENCOUNTER — Encounter (HOSPITAL_BASED_OUTPATIENT_CLINIC_OR_DEPARTMENT_OTHER): Payer: Self-pay

## 2022-05-01 DIAGNOSIS — I4891 Unspecified atrial fibrillation: Secondary | ICD-10-CM

## 2022-05-01 MED ORDER — APIXABAN 5 MG PO TABS: 5.0000 mg | ORAL_TABLET | Freq: Two times a day (BID) | ORAL | 1 refills | Status: DC

## 2022-05-01 NOTE — Telephone Encounter (Signed)
Eliquis please

## 2022-05-01 NOTE — Addendum Note (Signed)
Addended by: Marlene Lard on: 05/01/2022 08:18 AM   Modules accepted: Orders

## 2022-05-01 NOTE — Telephone Encounter (Signed)
Please review for refill. Thank you! 

## 2022-05-01 NOTE — Telephone Encounter (Signed)
FYI

## 2022-05-04 ENCOUNTER — Other Ambulatory Visit (HOSPITAL_COMMUNITY): Payer: Self-pay | Admitting: Nurse Practitioner

## 2022-05-04 DIAGNOSIS — I4892 Unspecified atrial flutter: Secondary | ICD-10-CM

## 2022-09-10 ENCOUNTER — Encounter (HOSPITAL_BASED_OUTPATIENT_CLINIC_OR_DEPARTMENT_OTHER): Payer: Self-pay

## 2022-09-10 DIAGNOSIS — I4892 Unspecified atrial flutter: Secondary | ICD-10-CM

## 2022-09-10 MED ORDER — DILTIAZEM HCL ER COATED BEADS 120 MG PO CP24: ORAL_CAPSULE | ORAL | 3 refills | Status: DC

## 2022-09-10 NOTE — Addendum Note (Signed)
Addended by: Marlene Lard on: 09/10/2022 03:11 PM   Modules accepted: Orders

## 2022-09-10 NOTE — Telephone Encounter (Signed)
Hey there, this patient was last seen by you. I do not see where we have noted that she went back on the 120mg  dose. Is this okay to refill?

## 2022-09-28 ENCOUNTER — Encounter (HOSPITAL_BASED_OUTPATIENT_CLINIC_OR_DEPARTMENT_OTHER): Payer: Self-pay

## 2022-09-29 MED ORDER — ATORVASTATIN CALCIUM 40 MG PO TABS: 40.0000 mg | ORAL_TABLET | Freq: Every day | ORAL | 0 refills | Status: DC

## 2022-10-23 ENCOUNTER — Other Ambulatory Visit (HOSPITAL_BASED_OUTPATIENT_CLINIC_OR_DEPARTMENT_OTHER): Payer: Self-pay | Admitting: Cardiology

## 2022-10-23 DIAGNOSIS — I4891 Unspecified atrial fibrillation: Secondary | ICD-10-CM

## 2022-10-23 NOTE — Telephone Encounter (Addendum)
Eliquis 5mg  refill request received. Patient is 57 years old, weight-85.7kg, Crea-0.80 on 08/17/2020-NEEDS LABS, Diagnosis-Afib, and last seen by Roderic Palau on 02/14/2022 and has an appt with Dr. Harrell Gave on 11/07/2022. Dose is appropriate based on dosing criteria.   Pt had a 90 day supply with refills sent to the pharmacy on 05/01/2022 and pt did not have any labs ordered or no call made to her regarding a lab appt.   Called pt and left her a message to call back regarding the refill received. Will need to let her know we need updated labs. Pt called back and was updated on needing labs. She stated she has not not been anywhere for labs and asked if she if she could have them done in 2 weeks with MD appt. Advised that will be fine and small supply of eliquis will be sent. Therefore, will send in a supply until appointment and made aware that labs are needed annually. She states she does not currently have a PCP so it will be through our office.   Order a CBC & BMET via [employer] and released order.

## 2022-11-07 ENCOUNTER — Encounter (HOSPITAL_BASED_OUTPATIENT_CLINIC_OR_DEPARTMENT_OTHER): Payer: Self-pay | Admitting: Cardiology

## 2022-11-07 ENCOUNTER — Ambulatory Visit (HOSPITAL_BASED_OUTPATIENT_CLINIC_OR_DEPARTMENT_OTHER): Payer: Managed Care, Other (non HMO) | Admitting: Cardiology

## 2022-11-07 VITALS — BP 132/68 | HR 61 | Ht 64.0 in | Wt 194.9 lb

## 2022-11-07 DIAGNOSIS — Z7901 Long term (current) use of anticoagulants: Secondary | ICD-10-CM

## 2022-11-07 DIAGNOSIS — Z8673 Personal history of transient ischemic attack (TIA), and cerebral infarction without residual deficits: Secondary | ICD-10-CM

## 2022-11-07 DIAGNOSIS — I4892 Unspecified atrial flutter: Secondary | ICD-10-CM | POA: Diagnosis not present

## 2022-11-07 DIAGNOSIS — Z7189 Other specified counseling: Secondary | ICD-10-CM

## 2022-11-07 DIAGNOSIS — R030 Elevated blood-pressure reading, without diagnosis of hypertension: Secondary | ICD-10-CM

## 2022-11-07 DIAGNOSIS — D6869 Other thrombophilia: Secondary | ICD-10-CM

## 2022-11-07 NOTE — Progress Notes (Signed)
Cardiology Office Note:    Date:  11/07/2022   ID:  Jane Sparks, DOB [birth year], MRN [id number]  PCP:  Ezequiel Essex, MD  Cardiologist:  Buford Dresser, MD  Referring MD: Ezequiel Essex, MD   CC: follow up  History of Present Illness:    Jane Sparks is a 57 y.o. female with a hx of CVA, atrial flutter who is seen for follow up today. I initially met her in the hospital 03/23/20 when she had new onset atrial flutter.  On 08/17/2020 she presented to the ED following 2 neurologic events, one 20-30 min episode of slurred speech, and one later 20-30 min episode of left hand weakness. Both episodes resolved spontaneously. MRI revealed evidence of embolic stroke. There was an acute infarction in the right MCA.   Today, she is feeling good overall. No further arrhythmic episodes since last year. She is compliant with 120 mg diltiazem. No bleeding issues on Eliquis.  In clinic today her blood pressure is 142/78 which she attributes to white coat hypertension (132/68 on recheck). She doesn't monitor her home blood pressures. Of note, she also endorses anxiety.  Due to severe allergies, she consistently feels short of breath which significantly affects her activity. However, she does try to walk for exercise. In the summer, she enjoys gardening.  Additionally she asks about starting COQ10 supplementation. We discussed current recommendations from a cardiovascular perspective.  She denies any palpitations, chest pain, or peripheral edema. No lightheadedness, headaches, syncope, orthopnea, or PND.   Past Medical History:  Diagnosis Date   Alcohol use    Anxiety    Atrial fibrillation with RVR ([place])    Smoker    Stroke ([place]) 06/2020   TIA    History reviewed. No pertinent surgical history.  Current Medications: Current Outpatient Medications on File Prior to Visit  Medication Sig   Cholecalciferol (VITAMIN D3) 125 MCG (5000 UT) CAPS Take 1 capsule by mouth daily.   ALREX  0.2 % SUSP Apply to eye as needed.   apixaban (ELIQUIS) 5 MG TABS tablet TAKE 1 TABLET BY MOUTH TWICE A DAY   atorvastatin (LIPITOR) 40 MG tablet Take 1 tablet (40 mg total) by mouth daily.   calcium carbonate (TUMS - DOSED IN MG ELEMENTAL CALCIUM) 500 MG chewable tablet Chew 1 tablet by mouth daily as needed for indigestion or heartburn.   cetirizine (ZYRTEC) 10 MG tablet Take 10 mg by mouth daily.   diltiazem (CARDIZEM CD) 120 MG 24 hr capsule TAKE 1 CAPSULE BY MOUTH EVERY DAY   diphenhydrAMINE (BENADRYL) 25 MG tablet Take 25 mg by mouth as needed for allergies.    famotidine (PEPCID) 20 MG tablet Take 20 mg by mouth as needed for heartburn or indigestion.    loteprednol (LOTEMAX) 0.5 % ophthalmic suspension Place into both eyes as needed.   No current facility-administered medications on file prior to visit.     Allergies:   Penicillins and Sulfa antibiotics   Social History   Tobacco Use   Smoking status: Former    Types: Cigarettes    Quit date: 03/2020    Years since quitting: 2.6   Smokeless tobacco: Never   Tobacco comments:    Former smoker stopped after hospital stay 03/24/2020  Substance Use Topics   Alcohol use: Yes    Alcohol/week: 7.0 standard drinks of alcohol    Types: 7 Standard drinks or equivalent per week    Comment: 1 mix drink daily 02/14/22   Drug use: Not Currently  Family History: family history includes Atrial fibrillation in her mother; CVA in her mother; Heart disease in her father.  Her mother had a stroke.  ROS:   Please see the history of present illness.   (+) Seasonal/Environmental allergies (+) Shortness of breath (+) Anxiety Additional pertinent ROS otherwise unremarkable.  EKGs/Labs/Other Studies Reviewed:    The following studies were reviewed today:  MRA Head/Neck 08/17/2020: COMPARISON:  Head CT earlier same day   FINDINGS: MRI HEAD FINDINGS   Brain: Diffusion imaging shows areas of acute infarction in the right MCA  territory. There is a focal 1 x 2 cm region of infarction affecting the right posterior frontal cortex. There is a larger region of cortical acute infarction at the right temporoparietal junction. No evidence of swelling or hemorrhage. Otherwise, the brain appears normal without evidence of any older insult. No mass, hydrocephalus or extra-axial collection.   Vascular: Major vessels at the base of the brain show flow.   Skull and upper cervical spine: Negative   Sinuses/Orbits: Clear/normal   Other: None   MRA HEAD FINDINGS   Both internal carotid arteries are widely patent through the skull base and siphon regions. The anterior and middle cerebral vessels are patent without proximal stenosis, aneurysm or vascular malformation. No large or medium vessel occlusion can be demonstrated on the right. Both posterior cerebral arteries take fetal origin from the anterior circulation.   Both vertebral arteries are patent at the foramen magnum. The right terminates in PICA. The left supplies the small basilar artery. Basilar artery terminates or nearly terminates in the superior cerebellar arteries. PCAs receive there supply from the anterior circulation.   MRA NECK FINDINGS   Both common carotid arteries widely patent to the bifurcation. Both carotid bifurcations are normal. No stenosis or irregularity. Both cervical internal carotid arteries are widely patent.   Both vertebral arteries are patent. The origins are not well seen because of chest motion. Both vessels are patent through the neck to the foramen magnum.   IMPRESSION: 1. Acute infarction in the right MCA territory affecting the posterior frontal cortex and a separate region in the right temporoparietal junction region. No evidence of swelling or hemorrhage at this time. Findings are consistent with embolic disease to the right MCA territory. 2. No intracranial large or medium vessel occlusion or correctable proximal  stenosis visible at this time. 3. No evidence of neck vessel pathology.  Echo 03/23/20  1. Inferior basal hypokinesis . Left ventricular ejection fraction, by  estimation, is 55%. The left ventricle has normal function. The left  ventricle demonstrates regional wall motion abnormalities (see scoring  diagram/findings for description). Left  ventricular diastolic parameters are indeterminate.   2. Right ventricular systolic function is normal. The right ventricular  size is normal. There is normal pulmonary artery systolic pressure.   3. Left atrial size was moderately dilated.   4. The mitral valve is normal in structure. Mild mitral valve  regurgitation. No evidence of mitral stenosis.   5. The aortic valve is tricuspid. Aortic valve regurgitation is trivial.  Mild aortic valve sclerosis is present, with no evidence of aortic valve  stenosis.   6. The inferior vena cava is normal in size with greater than 50%  respiratory variability, suggesting right atrial pressure of 3 mmHg.  EKG:  EKG is personally reviewed.   11/07/2022:  NSR at 61 bpm with RBBB 09/14/2021: Sinus rhythm. Rate 69 bpm. 05/22/2020: sinus rhythm at 67 bpm.  Recent Labs: No results found for  requested labs within last 365 days.   Recent Lipid Panel    Component Value Date/Time   CHOL 148 08/17/2020 1015   TRIG 68 08/17/2020 1015   HDL 34 (L) 08/17/2020 1015   CHOLHDL 4.4 08/17/2020 1015   VLDL 14 08/17/2020 1015   LDLCALC 100 (H) 08/17/2020 1015    Physical Exam:    VS:  BP 132/68 (BP Location: Right Arm, Patient Position: Sitting, Cuff Size: Large)   Pulse 61   Ht '5\' 4"'$  (1.626 m)   Wt 194 lb 14.4 oz (88.4 kg)   BMI 33.45 kg/m     Wt Readings from Last 3 Encounters:  11/07/22 194 lb 14.4 oz (88.4 kg)  02/14/22 189 lb (85.7 kg)  02/07/22 189 lb 3.2 oz (85.8 kg)    GEN: Well nourished, well developed in no acute distress HEENT: Normal, moist mucous membranes NECK: No JVD CARDIAC: regular rhythm,  normal S1 and S2, no rubs or gallops. No murmurs. VASCULAR: Radial and DP pulses 2+ bilaterally. No carotid bruits RESPIRATORY:  Clear to auscultation without rales, wheezing or rhonchi  ABDOMEN: Soft, non-tender, non-distended MUSCULOSKELETAL:  Ambulates independently SKIN: Warm and dry, no edema NEUROLOGIC:  Alert and oriented x 3. No focal neuro deficits noted. PSYCHIATRIC:  Normal affect    ASSESSMENT:    1. Paroxysmal atrial flutter ([place])   2. Secondary hypercoagulable state ([place])   3. Long term current use of anticoagulant   4. Cardiac risk counseling   5. History of CVA (cerebrovascular accident)   6. White coat syndrome without diagnosis of hypertension   7. Counseling on health promotion and disease prevention     PLAN:    Paroxysmal atrial flutter History of CVA -CHA2DS2/VAS Stroke Risk Points=3  -continue apixaban -tolerating diltiazem, continue  -discussed red flag warning signs, when to call the office and when to go to ER -on statin, tolerating -quit smoking 03/2020 -ECG in sinus rhythm today  White coat syndrome -reports BP always higher in the office, normal when she has checked at home (though no recent home Bps) -follow  Cardiac risk counseling and prevention recommendations: -recommend heart healthy/Mediterranean diet, with whole grains, fruits, vegetable, fish, lean meats, nuts, and olive oil. Limit salt. -recommend moderate walking, 3-5 times/week for 30-50 minutes each session. Aim for at least 150 minutes.week. Goal should be pace of 3 miles/hours, or walking 1.5 miles in 30 minutes -recommend avoidance of tobacco products. Avoid excess alcohol.   Plan for follow up: 1 year or sooner as needed  Buford Dresser, MD, PhD [place]  [facility]    Medication Adjustments/Labs and Tests Ordered: Current medicines are reviewed at length with the patient today.  Concerns regarding medicines are outlined above.   Orders Placed This Encounter   Procedures   EKG 12-Lead   No orders of the defined types were placed in this encounter.  Patient Instructions  Medication Instructions:   The current medical regimen is effective;  continue present plan and medications.   *If you need a refill on your cardiac medications before your next appointment, please call your pharmacy*   Lab Work: Dr Harrell Gave wants to you have a Basic Metabolic Panel and Complete Blood Count done today  If you have labs (blood work) drawn today and your tests are completely normal, you will receive your results only by: [place] (if you have MyChart) OR A paper copy in the mail If you have any lab test that is abnormal or we need to  change your treatment, we will call you to review the results.   Testing/Procedures: None   Follow-Up: At [facility], you and your health needs are our priority.  As part of our continuing mission to provide you with exceptional heart care, we have created designated Provider Care Teams.  These Care Teams include your primary Cardiologist (physician) and Advanced Practice Providers (APPs -  Physician Assistants and Nurse Practitioners) who all work together to provide you with the care you need, when you need it.  We recommend signing up for the patient portal called "MyChart".  Sign up information is provided on this After Visit Summary.  MyChart is used to connect with patients for Virtual Visits (Telemedicine).  Patients are able to view lab/test results, encounter notes, upcoming appointments, etc.  Non-urgent messages can be sent to your provider as well.   To learn more about what you can do with MyChart, go to [email or web address].    Your next appointment:   1 year(s)  Provider:   Buford Dresser, MD    Other Instructions None    I,Mathew Stumpf,acting as a scribe for Buford Dresser, MD.,have documented all relevant documentation on the behalf of Buford Dresser, MD,as  directed by  Buford Dresser, MD while in the presence of Buford Dresser, MD.  I, Buford Dresser, MD, have reviewed all documentation for this visit. The documentation on 11/07/22 for the exam, diagnosis, procedures, and orders are all accurate and complete.   Signed, Buford Dresser, MD PhD 11/07/2022   [place]

## 2022-11-07 NOTE — Patient Instructions (Signed)
Medication Instructions:   The current medical regimen is effective;  continue present plan and medications.   *If you need a refill on your cardiac medications before your next appointment, please call your pharmacy*   Lab Work: Dr Harrell Gave wants to you have a Basic Metabolic Panel and Complete Blood Count done today  If you have labs (blood work) drawn today and your tests are completely normal, you will receive your results only by: [place] (if you have MyChart) OR A paper copy in the mail If you have any lab test that is abnormal or we need to change your treatment, we will call you to review the results.   Testing/Procedures: None   Follow-Up: At [facility], you and your health needs are our priority.  As part of our continuing mission to provide you with exceptional heart care, we have created designated Provider Care Teams.  These Care Teams include your primary Cardiologist (physician) and Advanced Practice Providers (APPs -  Physician Assistants and Nurse Practitioners) who all work together to provide you with the care you need, when you need it.  We recommend signing up for the patient portal called "MyChart".  Sign up information is provided on this After Visit Summary.  MyChart is used to connect with patients for Virtual Visits (Telemedicine).  Patients are able to view lab/test results, encounter notes, upcoming appointments, etc.  Non-urgent messages can be sent to your provider as well.   To learn more about what you can do with MyChart, go to [email or web address].    Your next appointment:   1 year(s)  Provider:   Buford Dresser, MD    Other Instructions None

## 2022-11-08 LAB — BASIC METABOLIC PANEL
BUN/Creatinine Ratio: 17 (ref 9–23)
BUN: 14 mg/dL (ref 6–24)
CO2: 20 mmol/L (ref 20–29)
Calcium: 9.9 mg/dL (ref 8.7–10.2)
Chloride: 102 mmol/L (ref 96–106)
Creatinine, Ser: 0.82 mg/dL (ref 0.57–1.00)
Glucose: 88 mg/dL (ref 70–99)
Potassium: 4.1 mmol/L (ref 3.5–5.2)
Sodium: 141 mmol/L (ref 134–144)
eGFR: 83 mL/min/{1.73_m2} (ref >59)

## 2022-11-14 ENCOUNTER — Encounter (HOSPITAL_COMMUNITY): Payer: Self-pay | Admitting: *Deleted

## 2022-11-28 ENCOUNTER — Other Ambulatory Visit (HOSPITAL_BASED_OUTPATIENT_CLINIC_OR_DEPARTMENT_OTHER): Payer: Self-pay | Admitting: Cardiology

## 2022-11-28 DIAGNOSIS — I4891 Unspecified atrial fibrillation: Secondary | ICD-10-CM

## 2022-11-28 NOTE — Telephone Encounter (Signed)
Please review for refill. Thank you! 

## 2022-11-28 NOTE — Telephone Encounter (Signed)
Prescription refill request for Eliquis received. Indication: Aflutter Last office visit:11/07/22 Harrell Gave)  Scr: 0.82 (11/07/22)  Age: 57 Weight: 88.4kg  Appropriate dose. Refill sent.

## 2022-12-27 ENCOUNTER — Other Ambulatory Visit (HOSPITAL_BASED_OUTPATIENT_CLINIC_OR_DEPARTMENT_OTHER): Payer: Self-pay | Admitting: Cardiology

## 2022-12-27 NOTE — Telephone Encounter (Signed)
Rx request sent to pharmacy.  

## 2023-01-17 ENCOUNTER — Telehealth (HOSPITAL_BASED_OUTPATIENT_CLINIC_OR_DEPARTMENT_OTHER): Payer: Self-pay

## 2023-01-17 NOTE — Telephone Encounter (Signed)
   Pre-operative Risk Assessment    Patient Name: Jane Sparks  DOB: May 01, 1966 MRN: [id number]     Request for Surgical Clearance    Procedure:  Dental Extraction - Amount of Teeth to be Pulled:  2  Date of Surgery:  Clearance TBD                                 Surgeon:  Not specified Surgeon's Group or Practice Name:  [facility] Phone number:  [phone] Fax number:  [phone]   Type of Clearance Requested:   - Pharmacy:  Hold Apixaban (Eliquis) 2 days prior   Type of Anesthesia:   Conscious sedation using Fentanyl and Midazolam   Additional requests/questions:  Does this patient need antibiotics?  Lorella Nimrod   01/17/2023, 11:36 AM

## 2023-01-17 NOTE — Telephone Encounter (Signed)
   Patient Name: Jane Sparks  DOB: 12-31-1965 MRN: [id number]  Primary Cardiologist: Jodelle Red, MD  Chart reviewed as part of pre-operative protocol coverage.   Dental extractions of 1-2 teeth are considered low risk procedures per guidelines and generally do not require any specific cardiac clearance. It is also generally accepted that for extractions of 1-2 teeth and dental cleanings, there is no need to interrupt blood thinner therapy.  SBE prophylaxis is not required for the patient from a cardiac standpoint.  I will route this recommendation to the requesting party via Epic fax function and remove from pre-op pool.  Please call with questions.  Carlos Levering, NP 01/17/2023, 12:11 PM

## 2023-02-20 ENCOUNTER — Encounter (HOSPITAL_BASED_OUTPATIENT_CLINIC_OR_DEPARTMENT_OTHER): Payer: Self-pay

## 2023-02-25 ENCOUNTER — Ambulatory Visit (HOSPITAL_BASED_OUTPATIENT_CLINIC_OR_DEPARTMENT_OTHER): Payer: Managed Care, Other (non HMO) | Admitting: Family

## 2023-02-25 ENCOUNTER — Encounter (HOSPITAL_BASED_OUTPATIENT_CLINIC_OR_DEPARTMENT_OTHER): Payer: Self-pay | Admitting: Family

## 2023-02-25 VITALS — BP 128/72 | HR 66 | Ht 64.0 in | Wt 200.0 lb

## 2023-02-25 DIAGNOSIS — I4892 Unspecified atrial flutter: Secondary | ICD-10-CM

## 2023-02-25 DIAGNOSIS — Z8673 Personal history of transient ischemic attack (TIA), and cerebral infarction without residual deficits: Secondary | ICD-10-CM | POA: Diagnosis not present

## 2023-02-25 DIAGNOSIS — I480 Paroxysmal atrial fibrillation: Secondary | ICD-10-CM | POA: Diagnosis not present

## 2023-02-25 DIAGNOSIS — D6859 Other primary thrombophilia: Secondary | ICD-10-CM

## 2023-02-25 DIAGNOSIS — Z720 Tobacco use: Secondary | ICD-10-CM

## 2023-02-25 DIAGNOSIS — R030 Elevated blood-pressure reading, without diagnosis of hypertension: Secondary | ICD-10-CM

## 2023-02-25 MED ORDER — METOPROLOL SUCCINATE ER 25 MG PO TB24: 25.0000 mg | ORAL_TABLET | Freq: Every day | ORAL | 2 refills | Status: DC

## 2023-02-25 NOTE — H&P (View-Only) (Signed)
 Office Visit    Patient Name: Jane Sparks Date of Encounter: 02/25/2023  PCP:  Lynn, Catherine, MD   [facility]  Cardiologist:  Bridgette Christopher, MD  Advanced Practice Provider:  No care team member to display Electrophysiologist:  None      Chief Complaint    Jane Sparks is a 56 y.o. female presents today for atrial fibrillation follow up    Past Medical History    Past Medical History:  Diagnosis Date   Alcohol use    Anxiety    Atrial fibrillation with RVR (HCC)    Smoker    Stroke (HCC) 06/2020   TIA   No past surgical history on file.  Allergies  Allergies  Allergen Reactions   Penicillins    Sulfa Antibiotics     History of Present Illness    Jane Sparks is a 56 y.o. female with a hx of CVA, atrial flutter last seen 11/07/22.  Diagnosed with new onset atrial flutter 03/23/2020.  10/18/2019 presented to ED following 2 neurological events (started speech, left hand weakness) MRI with acute embolic stroke with acute infarction in the right MCA.  Last seen 11/07/2022 noting no arrhythmic episodes in 1 year stable on diltiazem 120 mg daily and Eliquis 5 mg twice daily.  Noted whitecoat hypertension with BP improved on recheck.  She was recommended to continue apixaban, diltiazem, statin, and for smoking cessation  She presents today for follow-up. Notes after having wisdom tooth and another tooth pulled has had recurrent atrial fibrillation. She was treated with cocktail of antibiotic and steroid which she attributes to her recurrent atrial fibrillation. She is symptomatic with her atrial fibrillation. Feels palpitations, more short of breath, decreased activity tolerance, fatigue. No missed doses of Eliquis of the last 3 weeks.   EKGs/Labs/Other Studies Reviewed:   The following studies were reviewed today: Cardiac Studies & Procedures       ECHOCARDIOGRAM  ECHOCARDIOGRAM COMPLETE 03/23/2020  Narrative ECHOCARDIOGRAM  REPORT    Patient Name:   Jane Sparks Date of Exam: 03/23/2020 Medical Rec #:  [id number]     Height:       64.0 in Accession #:    [id number]    Weight:       191.0 lb Date of Birth:  09/06/1966    BSA:          1.918 m Patient Age:    53 years      BP:           127/70 mmHg Patient Gender: F             HR:           79 bpm. Exam Location:  Inpatient  Procedure: 2D Echo, Cardiac Doppler and Color Doppler  Indications:    Atrial Flutter 427.32 / I48.92  History:        Patient has no prior history of Echocardiogram examinations. Arrythmias:Atrial Flutter; Risk Factors:Current Smoker.  Sonographer:    Julia Swaim RDCS Referring Phys: [id number] SCOTT ZACKOWSKI  IMPRESSIONS   1. Inferior basal hypokinesis . Left ventricular ejection fraction, by estimation, is 55%. The left ventricle has normal function. The left ventricle demonstrates regional wall motion abnormalities (see scoring diagram/findings for description). Left ventricular diastolic parameters are indeterminate. 2. Right ventricular systolic function is normal. The right ventricular size is normal. There is normal pulmonary artery systolic pressure. 3. Left atrial size was moderately dilated. 4. The mitral valve is normal in structure. Mild   mitral valve regurgitation. No evidence of mitral stenosis. 5. The aortic valve is tricuspid. Aortic valve regurgitation is trivial. Mild aortic valve sclerosis is present, with no evidence of aortic valve stenosis. 6. The inferior vena cava is normal in size with greater than 50% respiratory variability, suggesting right atrial pressure of 3 mmHg.  FINDINGS Left Ventricle: Inferior basal hypokinesis. Left ventricular ejection fraction, by estimation, is 55%. The left ventricle has normal function. The left ventricle demonstrates regional wall motion abnormalities. The left ventricular internal cavity size was normal in size. There is no left ventricular hypertrophy. Left ventricular  diastolic parameters are indeterminate.  Right Ventricle: The right ventricular size is normal. No increase in right ventricular wall thickness. Right ventricular systolic function is normal. There is normal pulmonary artery systolic pressure. The tricuspid regurgitant velocity is 1.85 m/s, and with an assumed right atrial pressure of 3 mmHg, the estimated right ventricular systolic pressure is 16.7 mmHg.  Left Atrium: Left atrial size was moderately dilated.  Right Atrium: Right atrial size was normal in size.  Pericardium: There is no evidence of pericardial effusion.  Mitral Valve: The mitral valve is normal in structure. Normal mobility of the mitral valve leaflets. Mild mitral valve regurgitation. No evidence of mitral valve stenosis.  Tricuspid Valve: The tricuspid valve is normal in structure. Tricuspid valve regurgitation is mild . No evidence of tricuspid stenosis.  Aortic Valve: The aortic valve is tricuspid. Aortic valve regurgitation is trivial. Mild aortic valve sclerosis is present, with no evidence of aortic valve stenosis.  Pulmonic Valve: The pulmonic valve was normal in structure. Pulmonic valve regurgitation is not visualized. No evidence of pulmonic stenosis.  Aorta: The aortic root is normal in size and structure.  Venous: The inferior vena cava is normal in size with greater than 50% respiratory variability, suggesting right atrial pressure of 3 mmHg.  IAS/Shunts: The interatrial septum was not well visualized.   LEFT VENTRICLE PLAX 2D LVIDd:         4.30 cm LVIDs:         3.20 cm LV PW:         0.70 cm LV IVS:        0.70 cm LVOT diam:     2.00 cm LV SV:         49 LV SV Index:   26 LVOT Area:     3.14 cm  LV Volumes (MOD) LV vol d, MOD A2C: 66.4 ml LV vol d, MOD A4C: 71.7 ml LV vol s, MOD A2C: 36.6 ml LV vol s, MOD A4C: 31.3 ml LV SV MOD A2C:     29.8 ml LV SV MOD A4C:     71.7 ml LV SV MOD BP:      35.2 ml  RIGHT VENTRICLE RV S prime:     10.30  cm/s TAPSE (M-mode): 1.7 cm  LEFT ATRIUM           Index       RIGHT ATRIUM           Index LA diam:      4.30 cm 2.24 cm/m  RA Area:     10.20 cm LA Vol (A2C): 24.7 ml 12.87 ml/m RA Volume:   18.00 ml  9.38 ml/m LA Vol (A4C): 24.6 ml 12.82 ml/m AORTIC VALVE LVOT Vmax:   83.60 cm/s LVOT Vmean:  60.500 cm/s LVOT VTI:    0.157 m  AORTA Ao Asc diam: 3.10 cm  TRICUSPID VALVE TR Peak grad:     13.7 mmHg TR Vmax:        185.00 cm/s  SHUNTS Systemic VTI:  0.16 m Systemic Diam: 2.00 cm  Peter Nishan MD Electronically signed by Peter Nishan MD Signature Date/Time: 03/23/2020/4:47:33 PM    Final              EKG:  EKG is  ordered today.  The ekg ordered today demonstrates rate controlled atrial fibrillation 66 bpm with no acute ST/T wave changes.  Recent Labs: 11/07/2022: BUN 14; Creatinine, Ser 0.82; Potassium 4.1; Sodium 141  Recent Lipid Panel    Component Value Date/Time   CHOL 148 08/17/2020 1015   TRIG 68 08/17/2020 1015   HDL 34 (L) 08/17/2020 1015   CHOLHDL 4.4 08/17/2020 1015   VLDL 14 08/17/2020 1015   LDLCALC 100 (H) 08/17/2020 1015    Risk Assessment/Calculations:   CHA2DS2-VASc Score = 3   This indicates a 3.2% annual risk of stroke. The patient's score is based upon: CHF History: 0 HTN History: 0 Diabetes History: 0 Stroke History: 2 Vascular Disease History: 0 Age Score: 0 Gender Score: 1     Home Medications   Current Meds  Medication Sig   ALREX 0.2 % SUSP Apply to eye as needed.   apixaban (ELIQUIS) 5 MG TABS tablet TAKE 1 TABLET BY MOUTH TWICE A DAY   atorvastatin (LIPITOR) 40 MG tablet TAKE 1 TABLET BY MOUTH EVERY DAY   calcium carbonate (TUMS - DOSED IN MG ELEMENTAL CALCIUM) 500 MG chewable tablet Chew 1 tablet by mouth daily as needed for indigestion or heartburn.   cetirizine (ZYRTEC) 10 MG tablet Take 10 mg by mouth daily.   Cholecalciferol (VITAMIN D3) 125 MCG (5000 UT) CAPS Take 1 capsule by mouth daily.   diltiazem (CARDIZEM  CD) 120 MG 24 hr capsule TAKE 1 CAPSULE BY MOUTH EVERY DAY   diphenhydrAMINE (BENADRYL) 25 MG tablet Take 25 mg by mouth as needed for allergies.    famotidine (PEPCID) 20 MG tablet Take 20 mg by mouth as needed for heartburn or indigestion.    loteprednol (LOTEMAX) 0.5 % ophthalmic suspension Place into both eyes as needed.   metoprolol succinate (TOPROL XL) 25 MG 24 hr tablet Take 1 tablet (25 mg total) by mouth daily.     Review of Systems      All other systems reviewed and are otherwise negative except as noted above.  Physical Exam    VS:  BP 128/72   Pulse 66   Ht 5' 4" (1.626 m)   Wt 200 lb (90.7 kg)   BMI 34.33 kg/m  , BMI Body mass index is 34.33 kg/m.  Wt Readings from Last 3 Encounters:  02/25/23 200 lb (90.7 kg)  11/07/22 194 lb 14.4 oz (88.4 kg)  02/14/22 189 lb (85.7 kg)    GEN: Well nourished, well developed, in no acute distress. HEENT: normal. Neck: Supple, no JVD, carotid bruits, or masses. Cardiac: IRIR, no murmurs, rubs, or gallops. No clubbing, cyanosis, edema.  Radials/PT 2+ and equal bilaterally.  Respiratory:  Respirations regular and unlabored, clear to auscultation bilaterally. GI: Soft, nontender, nondistended. MS: No deformity or atrophy. Skin: Warm and dry, no rash. Neuro:  Strength and sensation are intact. Psych: Normal affect.  Assessment & Plan    Atrial fibrillation / Atrial flutter / Hypercoagulable state - EKG today recurrent atrial fibrillation 66bpm. She is symptomatic with atrial fibrillation with exercise intolerance, palpitations, dyspnea.  Update TSH, CBC, BMP, magnesium.  Likely precipitated by recent   prednisone use with dental surgery and dehydration with GI upset after antibiotic use.  Continue diltiazem 100 mg daily.  Add Toprol 25 mg nightly to see if she will self convert.  Will go ahead and schedule for cardioversion with Dr. Christopher-no missed doses of Eliquis over the last 3 weeks. Can DC Toprol post cardioversion. If she  self-converts, she will contact us. CHA2DS2-VASc Score = 3 [CHF History: 0, HTN History: 0, Diabetes History: 0, Stroke History: 2, Vascular Disease History: 0, Age Score: 0, Gender Score: 1].  Therefore, the patient's annual risk of stroke is 3.2 %.      Shared Decision Making/Informed Consent The risks (stroke, cardiac arrhythmias rarely resulting in the need for a temporary or permanent pacemaker, skin irritation or burns and complications associated with conscious sedation including aspiration, arrhythmia, respiratory failure and death), benefits (restoration of normal sinus rhythm) and alternatives of a direct current cardioversion were explained in detail to Jane Sparks and she agrees to proceed.    Tobacco use - Smoking cessation encouraged. Recommend utilization of 1800QUITNOW.   Whitecoat hypertension-initial BP 146/86 with improved 128/72 without intervention.  No indication for antihypertensive regimen at this time.  Hx of CVA -continue atorvastatin.  No aspirin due to OAC.         Disposition: Follow up  2 to 3 weeks after cardioversion  with Bridgette Christopher, MD or APP.  Signed, Jane Axelson S Legacy [place], NP 02/25/2023, 12:59 PM [facility] 

## 2023-02-25 NOTE — Patient Instructions (Addendum)
Medication Instructions:  Your physician has recommended you make the following change in your medication:   Start: Metoprolol Succinate 25mg  daily in the evening  *If you need a refill on your cardiac medications before your next appointment, please call your pharmacy*   Lab Work: Your physician recommends that you return for lab work today- BMP, CBC, TSH, and Mag  If you have labs (blood work) drawn today and your tests are completely normal, you will receive your results only by: MyChart Message (if you have MyChart) OR A paper copy in the mail If you have any lab test that is abnormal or we need to change your treatment, we will call you to review the results.   Testing/Procedures: You are scheduled for a Cardioversion on Friday, May 31 with Dr. Cristal Deer.  Please arrive at the [facility] (Main Entrance A) at [facility]: [address] [place], [place] [address] at 9:30 AM (This time is 1 hour(s) before your procedure to ensure your preparation). Free valet parking service is available. You will check in at ADMITTING. The support person will be asked to wait in the waiting room.  It is OK to have someone drop you off and come back when you are ready to be discharged.     DIET:  Nothing to eat or drink after midnight except a sip of water with medications (see medication instructions below)  MEDICATION INSTRUCTIONS: !!IF ANY NEW MEDICATIONS ARE STARTED AFTER TODAY, PLEASE NOTIFY YOUR PROVIDER AS SOON AS POSSIBLE!!  FYI: Medications such as Semaglutide (Ozempic, [place]), Tirzepatide (Mounjaro, Zepbound), Dulaglutide (Trulicity), etc ("GLP1 agonists") must be held around the time of a procedure. Talk to your provider if you take one of these. Continue taking your anticoagulant (blood thinner): Apixaban (Eliquis).  You will need to continue this after your procedure until you are told by your provider that it is safe to stop.    LABS: done at [facility] on 5/21  FYI:  For your  safety, and to allow [place] to monitor your vital signs accurately during the surgery/procedure we request: If you have artificial nails, gel coating, SNS etc, please have those removed prior to your surgery/procedure. Not having the nail coverings /polish removed may result in cancellation or delay of your surgery/procedure.  You must have a responsible person to drive you home and stay in the waiting area during your procedure. Failure to do so could result in cancellation.  Bring your insurance cards.  *Special Note: Every effort is made to have your procedure done on time. Occasionally there are emergencies that occur at the hospital that may cause delays. Please be patient if a delay does occur.    Follow-Up: At [facility], you and your health needs are our priority.  As part of our continuing mission to provide you with exceptional heart care, we have created designated Provider Care Teams.  These Care Teams include your primary Cardiologist (physician) and Advanced Practice Providers (APPs -  Physician Assistants and Nurse Practitioners) who all work together to provide you with the care you need, when you need it.  We recommend signing up for the patient portal called "MyChart".  Sign up information is provided on this After Visit Summary.  MyChart is used to connect with patients for Virtual Visits (Telemedicine).  Patients are able to view lab/test results, encounter notes, upcoming appointments, etc.  Non-urgent messages can be sent to your provider as well.   To learn more about what you can do  with MyChart, go to [email or web address].    Your next appointment:   2-3 week(s)  Provider:   Jodelle Red, MD or Gillian Shields, NP

## 2023-02-25 NOTE — Progress Notes (Signed)
Office Visit    Patient Name: Jane Sparks Date of Encounter: 02/25/2023  PCP:  Fayette Pho, MD    [facility]  Cardiologist:  Jodelle Red, MD  Advanced Practice Provider:  No care team member to display Electrophysiologist:  None      Chief Complaint    Jane Sparks is a 57 y.o. female presents today for atrial fibrillation follow up    Past Medical History    Past Medical History:  Diagnosis Date   Alcohol use    Anxiety    Atrial fibrillation with RVR (HCC)    Smoker    Stroke (HCC) 06/2020   TIA   No past surgical history on file.  Allergies  Allergies  Allergen Reactions   Penicillins    Sulfa Antibiotics     History of Present Illness    Jane Sparks is a 57 y.o. female with a hx of CVA, atrial flutter last seen 11/07/22.  Diagnosed with new onset atrial flutter 03/23/2020.  10/18/2019 presented to ED following 2 neurological events (started speech, left hand weakness) MRI with acute embolic stroke with acute infarction in the right MCA.  Last seen 11/07/2022 noting no arrhythmic episodes in 1 year stable on diltiazem 120 mg daily and Eliquis 5 mg twice daily.  Noted whitecoat hypertension with BP improved on recheck.  She was recommended to continue apixaban, diltiazem, statin, and for smoking cessation  She presents today for follow-up. Notes after having wisdom tooth and another tooth pulled has had recurrent atrial fibrillation. She was treated with cocktail of antibiotic and steroid which she attributes to her recurrent atrial fibrillation. She is symptomatic with her atrial fibrillation. Feels palpitations, more short of breath, decreased activity tolerance, fatigue. No missed doses of Eliquis of the last 3 weeks.   EKGs/Labs/Other Studies Reviewed:   The following studies were reviewed today: Cardiac Studies & Procedures       ECHOCARDIOGRAM  ECHOCARDIOGRAM COMPLETE 03/23/2020  Narrative ECHOCARDIOGRAM  REPORT    Patient Name:   DEONKA DONOHOE Date of Exam: 03/23/2020 Medical Rec #:  [id number]     Height:       64.0 in Accession #:    [id number]    Weight:       191.0 lb Date of Birth:  1966/02/08    BSA:          1.918 m Patient Age:    53 years      BP:           127/70 mmHg Patient Gender: F             HR:           79 bpm. Exam Location:  Inpatient  Procedure: 2D Echo, Cardiac Doppler and Color Doppler  Indications:    Atrial Flutter 427.32 / I48.92  History:        Patient has no prior history of Echocardiogram examinations. Arrythmias:Atrial Flutter; Risk Factors:Current Smoker.  Sonographer:    Renella Cunas RDCS Referring Phys: 35 SCOTT ZACKOWSKI  IMPRESSIONS   1. Inferior basal hypokinesis . Left ventricular ejection fraction, by estimation, is 55%. The left ventricle has normal function. The left ventricle demonstrates regional wall motion abnormalities (see scoring diagram/findings for description). Left ventricular diastolic parameters are indeterminate. 2. Right ventricular systolic function is normal. The right ventricular size is normal. There is normal pulmonary artery systolic pressure. 3. Left atrial size was moderately dilated. 4. The mitral valve is normal in structure. Mild  mitral valve regurgitation. No evidence of mitral stenosis. 5. The aortic valve is tricuspid. Aortic valve regurgitation is trivial. Mild aortic valve sclerosis is present, with no evidence of aortic valve stenosis. 6. The inferior vena cava is normal in size with greater than 50% respiratory variability, suggesting right atrial pressure of 3 mmHg.  FINDINGS Left Ventricle: Inferior basal hypokinesis. Left ventricular ejection fraction, by estimation, is 55%. The left ventricle has normal function. The left ventricle demonstrates regional wall motion abnormalities. The left ventricular internal cavity size was normal in size. There is no left ventricular hypertrophy. Left ventricular  diastolic parameters are indeterminate.  Right Ventricle: The right ventricular size is normal. No increase in right ventricular wall thickness. Right ventricular systolic function is normal. There is normal pulmonary artery systolic pressure. The tricuspid regurgitant velocity is 1.85 m/s, and with an assumed right atrial pressure of 3 mmHg, the estimated right ventricular systolic pressure is 16.7 mmHg.  Left Atrium: Left atrial size was moderately dilated.  Right Atrium: Right atrial size was normal in size.  Pericardium: There is no evidence of pericardial effusion.  Mitral Valve: The mitral valve is normal in structure. Normal mobility of the mitral valve leaflets. Mild mitral valve regurgitation. No evidence of mitral valve stenosis.  Tricuspid Valve: The tricuspid valve is normal in structure. Tricuspid valve regurgitation is mild . No evidence of tricuspid stenosis.  Aortic Valve: The aortic valve is tricuspid. Aortic valve regurgitation is trivial. Mild aortic valve sclerosis is present, with no evidence of aortic valve stenosis.  Pulmonic Valve: The pulmonic valve was normal in structure. Pulmonic valve regurgitation is not visualized. No evidence of pulmonic stenosis.  Aorta: The aortic root is normal in size and structure.  Venous: The inferior vena cava is normal in size with greater than 50% respiratory variability, suggesting right atrial pressure of 3 mmHg.  IAS/Shunts: The interatrial septum was not well visualized.   LEFT VENTRICLE PLAX 2D LVIDd:         4.30 cm LVIDs:         3.20 cm LV PW:         0.70 cm LV IVS:        0.70 cm LVOT diam:     2.00 cm LV SV:         49 LV SV Index:   26 LVOT Area:     3.14 cm  LV Volumes (MOD) LV vol d, MOD A2C: 66.4 ml LV vol d, MOD A4C: 71.7 ml LV vol s, MOD A2C: 36.6 ml LV vol s, MOD A4C: 31.3 ml LV SV MOD A2C:     29.8 ml LV SV MOD A4C:     71.7 ml LV SV MOD BP:      35.2 ml  RIGHT VENTRICLE RV S prime:     10.30  cm/s TAPSE (M-mode): 1.7 cm  LEFT ATRIUM           Index       RIGHT ATRIUM           Index LA diam:      4.30 cm 2.24 cm/m  RA Area:     10.20 cm LA Vol (A2C): 24.7 ml 12.87 ml/m RA Volume:   18.00 ml  9.38 ml/m LA Vol (A4C): 24.6 ml 12.82 ml/m AORTIC VALVE LVOT Vmax:   83.60 cm/s LVOT Vmean:  60.500 cm/s LVOT VTI:    0.157 m  AORTA Ao Asc diam: 3.10 cm  TRICUSPID VALVE TR Peak grad:  13.7 mmHg TR Vmax:        185.00 cm/s  SHUNTS Systemic VTI:  0.16 m Systemic Diam: 2.00 cm  Charlton Haws MD Electronically signed by Charlton Haws MD Signature Date/Time: 03/23/2020/4:47:33 PM    Final              EKG:  EKG is  ordered today.  The ekg ordered today demonstrates rate controlled atrial fibrillation 66 bpm with no acute ST/T wave changes.  Recent Labs: 11/07/2022: BUN 14; Creatinine, Ser 0.82; Potassium 4.1; Sodium 141  Recent Lipid Panel    Component Value Date/Time   CHOL 148 08/17/2020 1015   TRIG 68 08/17/2020 1015   HDL 34 (L) 08/17/2020 1015   CHOLHDL 4.4 08/17/2020 1015   VLDL 14 08/17/2020 1015   LDLCALC 100 (H) 08/17/2020 1015    Risk Assessment/Calculations:   CHA2DS2-VASc Score = 3   This indicates a 3.2% annual risk of stroke. The patient's score is based upon: CHF History: 0 HTN History: 0 Diabetes History: 0 Stroke History: 2 Vascular Disease History: 0 Age Score: 0 Gender Score: 1     Home Medications   Current Meds  Medication Sig   ALREX 0.2 % SUSP Apply to eye as needed.   apixaban (ELIQUIS) 5 MG TABS tablet TAKE 1 TABLET BY MOUTH TWICE A DAY   atorvastatin (LIPITOR) 40 MG tablet TAKE 1 TABLET BY MOUTH EVERY DAY   calcium carbonate (TUMS - DOSED IN MG ELEMENTAL CALCIUM) 500 MG chewable tablet Chew 1 tablet by mouth daily as needed for indigestion or heartburn.   cetirizine (ZYRTEC) 10 MG tablet Take 10 mg by mouth daily.   Cholecalciferol (VITAMIN D3) 125 MCG (5000 UT) CAPS Take 1 capsule by mouth daily.   diltiazem (CARDIZEM  CD) 120 MG 24 hr capsule TAKE 1 CAPSULE BY MOUTH EVERY DAY   diphenhydrAMINE (BENADRYL) 25 MG tablet Take 25 mg by mouth as needed for allergies.    famotidine (PEPCID) 20 MG tablet Take 20 mg by mouth as needed for heartburn or indigestion.    loteprednol (LOTEMAX) 0.5 % ophthalmic suspension Place into both eyes as needed.   metoprolol succinate (TOPROL XL) 25 MG 24 hr tablet Take 1 tablet (25 mg total) by mouth daily.     Review of Systems      All other systems reviewed and are otherwise negative except as noted above.  Physical Exam    VS:  BP 128/72   Pulse 66   Ht 5\' 4"  (1.626 m)   Wt 200 lb (90.7 kg)   BMI 34.33 kg/m  , BMI Body mass index is 34.33 kg/m.  Wt Readings from Last 3 Encounters:  02/25/23 200 lb (90.7 kg)  11/07/22 194 lb 14.4 oz (88.4 kg)  02/14/22 189 lb (85.7 kg)    GEN: Well nourished, well developed, in no acute distress. HEENT: normal. Neck: Supple, no JVD, carotid bruits, or masses. Cardiac: IRIR, no murmurs, rubs, or gallops. No clubbing, cyanosis, edema.  Radials/PT 2+ and equal bilaterally.  Respiratory:  Respirations regular and unlabored, clear to auscultation bilaterally. GI: Soft, nontender, nondistended. MS: No deformity or atrophy. Skin: Warm and dry, no rash. Neuro:  Strength and sensation are intact. Psych: Normal affect.  Assessment & Plan    Atrial fibrillation / Atrial flutter / Hypercoagulable state - EKG today recurrent atrial fibrillation 66bpm. She is symptomatic with atrial fibrillation with exercise intolerance, palpitations, dyspnea.  Update TSH, CBC, BMP, magnesium.  Likely precipitated by recent  prednisone use with dental surgery and dehydration with GI upset after antibiotic use.  Continue diltiazem 100 mg daily.  Add Toprol 25 mg nightly to see if she will self convert.  Will go ahead and schedule for cardioversion with Dr. Clayborne Artist missed doses of Eliquis over the last 3 weeks. Can DC Toprol post cardioversion. If she  self-converts, she will contact us. CHA2DS2-VASc Score = 3 [CHF History: 0, HTN History: 0, Diabetes History: 0, Stroke History: 2, Vascular Disease History: 0, Age Score: 0, Gender Score: 1].  Therefore, the patient's annual risk of stroke is 3.2 %.      Shared Decision Making/Informed Consent The risks (stroke, cardiac arrhythmias rarely resulting in the need for a temporary or permanent pacemaker, skin irritation or burns and complications associated with conscious sedation including aspiration, arrhythmia, respiratory failure and death), benefits (restoration of normal sinus rhythm) and alternatives of a direct current cardioversion were explained in detail to Jane Sparks and she agrees to proceed.    Tobacco use - Smoking cessation encouraged. Recommend utilization of 1800QUITNOW.   Whitecoat hypertension-initial BP 146/86 with improved 128/72 without intervention.  No indication for antihypertensive regimen at this time.  Hx of CVA -continue atorvastatin.  No aspirin due to [facility].         Disposition: Follow up  2 to 3 weeks after cardioversion  with Jodelle Red, MD or APP.  Signed, Alver Sorrow, NP 02/25/2023, 12:59 PM [facility]

## 2023-02-26 LAB — BASIC METABOLIC PANEL
BUN/Creatinine Ratio: 15 (ref 9–23)
BUN: 13 mg/dL (ref 6–24)
CO2: 22 mmol/L (ref 20–29)
Calcium: 9.9 mg/dL (ref 8.7–10.2)
Chloride: 104 mmol/L (ref 96–106)
Creatinine, Ser: 0.87 mg/dL (ref 0.57–1.00)
Glucose: 87 mg/dL (ref 70–99)
Potassium: 4.9 mmol/L (ref 3.5–5.2)
Sodium: 141 mmol/L (ref 134–144)
eGFR: 78 mL/min/{1.73_m2} (ref >59)

## 2023-02-26 LAB — CBC
Hematocrit: 44.8 % (ref 34.0–46.6)
Hemoglobin: 14.3 g/dL (ref 11.1–15.9)
MCH: 27.9 pg (ref 26.6–33.0)
MCHC: 31.9 g/dL (ref 31.5–35.7)
MCV: 87 fL (ref 79–97)
Platelets: 335 10*3/uL (ref 150–450)
RBC: 5.13 x10E6/uL (ref 3.77–5.28)
RDW: 13.6 % (ref 11.7–15.4)
WBC: 6.3 10*3/uL (ref 3.4–10.8)

## 2023-02-26 LAB — TSH: TSH: 1.65 u[IU]/mL (ref 0.450–4.500)

## 2023-02-26 LAB — MAGNESIUM: Magnesium: 2.1 mg/dL (ref 1.6–2.3)

## 2023-03-06 ENCOUNTER — Encounter (HOSPITAL_BASED_OUTPATIENT_CLINIC_OR_DEPARTMENT_OTHER): Payer: Self-pay

## 2023-03-06 ENCOUNTER — Encounter (HOSPITAL_COMMUNITY): Payer: Self-pay

## 2023-03-06 NOTE — Anesthesia Preprocedure Evaluation (Addendum)
Anesthesia Evaluation  Patient identified by MRN, date of birth, ID band Patient awake    Reviewed: Allergy & Precautions, NPO status , Patient's Chart, lab work & pertinent test results, reviewed documented beta blocker date and time   Airway Mallampati: I  TM Distance: >3 FB Neck ROM: Full    Dental no notable dental hx. (+) Teeth Intact, Dental Advisory Given   Pulmonary former smoker   Pulmonary exam normal breath sounds clear to auscultation       Cardiovascular hypertension, Pt. on home beta blockers and Pt. on medications Normal cardiovascular exam+ dysrhythmias (eliquis, no missed doses) Atrial Fibrillation  Rhythm:Irregular Rate:Normal  TTE 2021 1. Inferior basal hypokinesis . Left ventricular ejection fraction, by  estimation, is 55%. The left ventricle has normal function. The left  ventricle demonstrates regional wall motion abnormalities (see scoring  diagram/findings for description). Left  ventricular diastolic parameters are indeterminate.   2. Right ventricular systolic function is normal. The right ventricular  size is normal. There is normal pulmonary artery systolic pressure.   3. Left atrial size was moderately dilated.   4. The mitral valve is normal in structure. Mild mitral valve  regurgitation. No evidence of mitral stenosis.   5. The aortic valve is tricuspid. Aortic valve regurgitation is trivial.  Mild aortic valve sclerosis is present, with no evidence of aortic valve  stenosis.   6. The inferior vena cava is normal in size with greater than 50%  respiratory variability, suggesting right atrial pressure of 3 mmHg.     Neuro/Psych  PSYCHIATRIC DISORDERS Anxiety Depression    CVA    GI/Hepatic negative GI ROS,,,(+)     substance abuse  alcohol use  Endo/Other  negative endocrine ROS    Renal/GU negative Renal ROS  negative genitourinary   Musculoskeletal negative musculoskeletal ROS (+)     Abdominal   Peds  Hematology negative hematology ROS (+)   Anesthesia Other Findings   Reproductive/Obstetrics                             Anesthesia Physical Anesthesia Plan  ASA: 3  Anesthesia Plan: General   Post-op Pain Management:    Induction: Intravenous  PONV Risk Score and Plan: Propofol infusion and Treatment may vary due to age or medical condition  Airway Management Planned: Natural Airway  Additional Equipment:   Intra-op Plan:   Post-operative Plan:   Informed Consent: I have reviewed the patients History and Physical, chart, labs and discussed the procedure including the risks, benefits and alternatives for the proposed anesthesia with the patient or authorized representative who has indicated his/her understanding and acceptance.     Dental advisory given  Plan Discussed with: CRNA  Anesthesia Plan Comments:        Anesthesia Quick Evaluation

## 2023-03-06 NOTE — Pre-Procedure Instructions (Signed)
Spoke to patient on phone regarding cardioversion tomorrow - arrive at 0930, NPO after midnight, confirmed patient has ride home and responsible person to stay with patient for 24 hours after procedure, confirmed no missed doses of Eliquis and instructed patient to continue taking and to take medications in the AM with a sip of water

## 2023-03-07 ENCOUNTER — Other Ambulatory Visit: Payer: Self-pay

## 2023-03-07 ENCOUNTER — Ambulatory Visit (HOSPITAL_COMMUNITY): Payer: Managed Care, Other (non HMO) | Admitting: Anesthesiology

## 2023-03-07 ENCOUNTER — Encounter (HOSPITAL_COMMUNITY)
Admission: RE | Disposition: A | Discharge status: Home / Self Care | Payer: Self-pay | Source: Home / Self Care | Attending: Cardiology

## 2023-03-07 ENCOUNTER — Encounter (HOSPITAL_COMMUNITY): Payer: Self-pay | Admitting: Cardiology

## 2023-03-07 ENCOUNTER — Ambulatory Visit (HOSPITAL_COMMUNITY)
Admission: RE | Admit: 2023-03-07 | Discharge: 2023-03-07 | Disposition: A | Discharge status: Home / Self Care | Payer: Managed Care, Other (non HMO) | Attending: Cardiology | Admitting: Cardiology

## 2023-03-07 DIAGNOSIS — F418 Other specified anxiety disorders: Secondary | ICD-10-CM | POA: Diagnosis not present

## 2023-03-07 DIAGNOSIS — Z7901 Long term (current) use of anticoagulants: Secondary | ICD-10-CM | POA: Insufficient documentation

## 2023-03-07 DIAGNOSIS — I4892 Unspecified atrial flutter: Secondary | ICD-10-CM | POA: Insufficient documentation

## 2023-03-07 DIAGNOSIS — D6859 Other primary thrombophilia: Secondary | ICD-10-CM | POA: Diagnosis not present

## 2023-03-07 DIAGNOSIS — I10 Essential (primary) hypertension: Secondary | ICD-10-CM

## 2023-03-07 DIAGNOSIS — I4891 Unspecified atrial fibrillation: Secondary | ICD-10-CM

## 2023-03-07 DIAGNOSIS — Z87891 Personal history of nicotine dependence: Secondary | ICD-10-CM | POA: Diagnosis not present

## 2023-03-07 DIAGNOSIS — Z8673 Personal history of transient ischemic attack (TIA), and cerebral infarction without residual deficits: Secondary | ICD-10-CM | POA: Diagnosis not present

## 2023-03-07 DIAGNOSIS — Z79899 Other long term (current) drug therapy: Secondary | ICD-10-CM | POA: Diagnosis not present

## 2023-03-07 HISTORY — PX: CARDIOVERSION: SHX1299

## 2023-03-07 SURGERY — CARDIOVERSION
Anesthesia: General

## 2023-03-07 MED ORDER — LIDOCAINE 2% (20 MG/ML) 5 ML SYRINGE
INTRAMUSCULAR | Status: DC | PRN
  Administered 2023-03-07: 60 mg via INTRAVENOUS

## 2023-03-07 MED ORDER — PROPOFOL 10 MG/ML IV BOLUS
INTRAVENOUS | Status: DC | PRN
  Administered 2023-03-07: 70 mg via INTRAVENOUS

## 2023-03-07 MED ORDER — SODIUM CHLORIDE 0.9 % IV SOLN: INTRAVENOUS | Status: DC | PRN

## 2023-03-07 SURGICAL SUPPLY — 1 items: ELECT DEFIB PAD ADLT CADENCE (PAD) ×1 IMPLANT

## 2023-03-07 NOTE — Anesthesia Postprocedure Evaluation (Signed)
Anesthesia Post Note  Patient: Jane Sparks  Procedure(s) Performed: CARDIOVERSION     Patient location during evaluation: PACU Anesthesia Type: General Level of consciousness: awake and alert Pain management: pain level controlled Vital Signs Assessment: post-procedure vital signs reviewed and stable Respiratory status: spontaneous breathing, nonlabored ventilation, respiratory function stable and patient connected to nasal cannula oxygen Cardiovascular status: blood pressure returned to baseline and stable Postop Assessment: no apparent nausea or vomiting Anesthetic complications: no  No notable events documented.  Last Vitals:  Vitals:   03/07/23 1040 03/07/23 1050  BP: 97/69 109/73  Pulse: (!) 51 (!) 51  Resp: (!) 9 15  Temp:    SpO2: 97% 99%    Last Pain:  Vitals:   03/07/23 1030  TempSrc: Temporal  PainSc: 0-No pain                 Christopherjohn Schiele L Markan Cazarez

## 2023-03-07 NOTE — Discharge Instructions (Signed)

## 2023-03-07 NOTE — Interval H&P Note (Signed)
History and Physical Interval Note:  03/07/2023 10:06 AM  Jane Sparks  has presented today for surgery, with the diagnosis of AFIB.  The various methods of treatment have been discussed with the patient and family. After consideration of risks, benefits and other options for treatment, the patient has consented to  Procedure(s): CARDIOVERSION (N/A) as a surgical intervention.  The patient's history has been reviewed, patient examined, no change in status, stable for surgery.  I have reviewed the patient's chart and labs.  Questions were answered to the patient's satisfaction.     Rhen Kawecki Cristal Deer

## 2023-03-07 NOTE — Transfer of Care (Signed)
Immediate Anesthesia Transfer of Care Note  Patient: Anysia Lehane  Procedure(s) Performed: CARDIOVERSION  Patient Location: Cath Lab  Anesthesia Type:General  Level of Consciousness: drowsy and patient cooperative  Airway & Oxygen Therapy: Patient Spontanous Breathing and Patient connected to nasal cannula oxygen  Post-op Assessment: Report given to RN, Post -op Vital signs reviewed and stable, and Patient moving all extremities X 4  Post vital signs: Reviewed and stable  Last Vitals:  Vitals Value Taken Time  BP 113/76 03/07/23 1022  Temp    Pulse 67 03/07/23 1023  Resp 19 03/07/23 1023  SpO2 93 % 03/07/23 1023  Vitals shown include unvalidated device data.  Last Pain:  Vitals:   03/07/23 0937  TempSrc: Temporal         Complications: No notable events documented.

## 2023-03-07 NOTE — CV Procedure (Signed)
Procedure:   DCCV  Indication:  Symptomatic atrial fibrillation  Procedure Note:  The patient signed informed consent.  They have had had therapeutic anticoagulation with apixaban greater than 3 weeks.  Anesthesia was administered by Dr. Armond Hang.  Patient received 60 mg IV lidocaine and 70 mg IV propofol.Adequate airway was maintained throughout and vital followed per protocol.  They were cardioverted x 1 with 120J of biphasic synchronized energy.  They converted to NSR.  There were no apparent complications.  The patient had normal neuro status and respiratory status post procedure with vitals stable as recorded elsewhere.    Follow up:  They will continue on current medical therapy and follow up with cardiology as scheduled.  Jodelle Red, MD PhD 03/07/2023 10:25 AM

## 2023-03-20 ENCOUNTER — Encounter (HOSPITAL_BASED_OUTPATIENT_CLINIC_OR_DEPARTMENT_OTHER): Payer: Self-pay | Admitting: Family

## 2023-03-20 ENCOUNTER — Ambulatory Visit (HOSPITAL_BASED_OUTPATIENT_CLINIC_OR_DEPARTMENT_OTHER): Payer: Managed Care, Other (non HMO) | Admitting: Family

## 2023-03-20 VITALS — BP 122/88 | HR 74 | Ht 64.0 in | Wt 199.0 lb

## 2023-03-20 DIAGNOSIS — D6859 Other primary thrombophilia: Secondary | ICD-10-CM

## 2023-03-20 DIAGNOSIS — Z8673 Personal history of transient ischemic attack (TIA), and cerebral infarction without residual deficits: Secondary | ICD-10-CM

## 2023-03-20 DIAGNOSIS — I4892 Unspecified atrial flutter: Secondary | ICD-10-CM

## 2023-03-20 DIAGNOSIS — R0602 Shortness of breath: Secondary | ICD-10-CM

## 2023-03-20 DIAGNOSIS — I480 Paroxysmal atrial fibrillation: Secondary | ICD-10-CM

## 2023-03-20 DIAGNOSIS — I4891 Unspecified atrial fibrillation: Secondary | ICD-10-CM

## 2023-03-20 MED ORDER — APIXABAN 5 MG PO TABS
5.0000 mg | ORAL_TABLET | Freq: Two times a day (BID) | ORAL | 2 refills | Status: DC
Start: 2023-03-20 — End: 2023-12-05

## 2023-03-20 MED ORDER — ALBUTEROL SULFATE HFA 108 (90 BASE) MCG/ACT IN AERS
1.0000 | INHALATION_SPRAY | Freq: Four times a day (QID) | RESPIRATORY_TRACT | 2 refills | Status: DC | PRN
Start: 2023-03-20 — End: 2024-07-22

## 2023-03-20 NOTE — Progress Notes (Signed)
Office Visit    Patient Name: Jane Sparks Date of Encounter: 03/20/2023  PCP:  Fayette Pho, MD   [facility]  Cardiologist:  Jodelle Red, MD  Advanced Practice Provider:  No care team member to display Electrophysiologist:  None      Chief Complaint    Jane Sparks is a 57 y.o. female presents today for follow-up after cardioversion  Past Medical History    Past Medical History:  Diagnosis Date   Alcohol use    Anxiety    Atrial fibrillation with RVR (HCC)    Smoker    Stroke (HCC) 06/2020   TIA   Past Surgical History:  Procedure Laterality Date   CARDIOVERSION N/A 03/07/2023   Procedure: CARDIOVERSION;  Surgeon: Jodelle Red, MD;  Location: [facility] INVASIVE CV LAB;  Service: Cardiovascular;  Laterality: N/A;    Allergies  Allergies  Allergen Reactions   Dexamethasone Other (See Comments)    Patient suspects contributing factor to Atrial fibrillation    Doxycycline Other (See Comments)    Patient suspects contributing factor to Atrial fibrillation    Penicillins Rash    Full body rash   Sulfa Antibiotics Rash    Full body rash     History of Present Illness    Jane Sparks is a 57 y.o. female with a hx of CVA, atrial flutter last seen for cardioversion 03/07/23.  Diagnosed with new onset atrial flutter 03/23/2020.  10/18/2019 presented to ED following 2 neurological events (started speech, left hand weakness) MRI with acute embolic stroke with acute infarction in the right MCA.  Seen 11/07/2022 noting no arrhythmic episodes in 1 year stable on diltiazem 120 mg daily and Eliquis 5 mg twice daily.  Noted whitecoat hypertension with BP improved on recheck.  She was recommended to continue apixaban, diltiazem, statin, and for smoking cessation  Last seen 02/25/23 with recurrent atrial fibrillation which she attributed to cocktail of antibiotic and steroid after dental work. Underwent DCCV 03/07/23 with restoration of  NSR.   Presents today for follow up. 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 Felt well for 6 days. Thursday had her best day then Friday was back in atrial fibrillation.  Symptomatic with palpitations, exertional dyspnea, exercise intolerance.  Avoids caffeine, alcohol.  No OTC proarrhythmic agents noted.  EKGs/Labs/Other Studies Reviewed:   The following studies were reviewed today:  Cardiac Studies & Procedures       ECHOCARDIOGRAM  ECHOCARDIOGRAM COMPLETE 03/23/2020  Narrative ECHOCARDIOGRAM REPORT    Patient Name:   Jane Sparks Date of Exam: 03/23/2020 Medical Rec #:  [id number]     Height:       64.0 in Accession #:    [id number]    Weight:       191.0 lb Date of Birth:  [birth year]    BSA:          1.918 m Patient Age:    53 years      BP:           127/70 mmHg Patient Gender: F             HR:           79 bpm. Exam Location:  Inpatient  Procedure: 2D Echo, Cardiac Doppler and Color Doppler  Indications:    Atrial Flutter 427.32 / I48.92  History:        Patient has no prior history of Echocardiogram examinations. Arrythmias:Atrial Flutter; Risk  Factors:Current Smoker.  Sonographer:    Renella Cunas RDCS Referring Phys: 63 SCOTT ZACKOWSKI  IMPRESSIONS   1. Inferior basal hypokinesis . Left ventricular ejection fraction, by estimation, is 55%. The left ventricle has normal function. The left ventricle demonstrates regional wall motion abnormalities (see scoring diagram/findings for description). Left ventricular diastolic parameters are indeterminate. 2. Right ventricular systolic function is normal. The right ventricular size is normal. There is normal pulmonary artery systolic pressure. 3. Left atrial size was moderately dilated. 4. The mitral valve is normal in structure. Mild mitral valve regurgitation. No evidence of mitral stenosis. 5. The aortic valve is tricuspid. Aortic valve regurgitation is trivial. Mild aortic valve  sclerosis is present, with no evidence of aortic valve stenosis. 6. The inferior vena cava is normal in size with greater than 50% respiratory variability, suggesting right atrial pressure of 3 mmHg.  FINDINGS Left Ventricle: Inferior basal hypokinesis. Left ventricular ejection fraction, by estimation, is 55%. The left ventricle has normal function. The left ventricle demonstrates regional wall motion abnormalities. The left ventricular internal cavity size was normal in size. There is no left ventricular hypertrophy. Left ventricular diastolic parameters are indeterminate.  Right Ventricle: The right ventricular size is normal. No increase in right ventricular wall thickness. Right ventricular systolic function is normal. There is normal pulmonary artery systolic pressure. The tricuspid regurgitant velocity is 1.85 m/s, and with an assumed right atrial pressure of 3 mmHg, the estimated right ventricular systolic pressure is 16.7 mmHg.  Left Atrium: Left atrial size was moderately dilated.  Right Atrium: Right atrial size was normal in size.  Pericardium: There is no evidence of pericardial effusion.  Mitral Valve: The mitral valve is normal in structure. Normal mobility of the mitral valve leaflets. Mild mitral valve regurgitation. No evidence of mitral valve stenosis.  Tricuspid Valve: The tricuspid valve is normal in structure. Tricuspid valve regurgitation is mild . No evidence of tricuspid stenosis.  Aortic Valve: The aortic valve is tricuspid. Aortic valve regurgitation is trivial. Mild aortic valve sclerosis is present, with no evidence of aortic valve stenosis.  Pulmonic Valve: The pulmonic valve was normal in structure. Pulmonic valve regurgitation is not visualized. No evidence of pulmonic stenosis.  Aorta: The aortic root is normal in size and structure.  Venous: The inferior vena cava is normal in size with greater than 50% respiratory variability, suggesting right atrial  pressure of 3 mmHg.  IAS/Shunts: The interatrial septum was not well visualized.   LEFT VENTRICLE PLAX 2D LVIDd:         4.30 cm LVIDs:         3.20 cm LV PW:         0.70 cm LV IVS:        0.70 cm LVOT diam:     2.00 cm LV SV:         49 LV SV Index:   26 LVOT Area:     3.14 cm  LV Volumes (MOD) LV vol d, MOD A2C: 66.4 ml LV vol d, MOD A4C: 71.7 ml LV vol s, MOD A2C: 36.6 ml LV vol s, MOD A4C: 31.3 ml LV SV MOD A2C:     29.8 ml LV SV MOD A4C:     71.7 ml LV SV MOD BP:      35.2 ml  RIGHT VENTRICLE RV S prime:     10.30 cm/s TAPSE (M-mode): 1.7 cm  LEFT ATRIUM           Index  RIGHT ATRIUM           Index LA diam:      4.30 cm 2.24 cm/m  RA Area:     10.20 cm LA Vol (A2C): 24.7 ml 12.87 ml/m RA Volume:   18.00 ml  9.38 ml/m LA Vol (A4C): 24.6 ml 12.82 ml/m AORTIC VALVE LVOT Vmax:   83.60 cm/s LVOT Vmean:  60.500 cm/s LVOT VTI:    0.157 m  AORTA Ao Asc diam: 3.10 cm  TRICUSPID VALVE TR Peak grad:   13.7 mmHg TR Vmax:        185.00 cm/s  SHUNTS Systemic VTI:  0.16 m Systemic Diam: 2.00 cm  Charlton Haws MD Electronically signed by Charlton Haws MD Signature Date/Time: 03/23/2020/4:47:33 PM    Final              EKG:  EKG is  ordered today.  The ekg ordered today demonstrates rate controlled atrial fibrillation 74 bpm with occasional PVC and RBBB no acute ST/T wave changes.  Recent Labs: 02/25/2023: BUN 13; Creatinine, Ser 0.87; Hemoglobin 14.3; Magnesium 2.1; Platelets 335; Potassium 4.9; Sodium 141; TSH 1.650  Recent Lipid Panel    Component Value Date/Time   CHOL 148 08/17/2020 1015   TRIG 68 08/17/2020 1015   HDL 34 (L) 08/17/2020 1015   CHOLHDL 4.4 08/17/2020 1015   VLDL 14 08/17/2020 1015   LDLCALC 100 (H) 08/17/2020 1015    Risk Assessment/Calculations:   CHA2DS2-VASc Score = 3   This indicates a 3.2% annual risk of stroke. The patient's score is based upon: CHF History: 0 HTN History: 0 Diabetes History: 0 Stroke History:  2 Vascular Disease History: 0 Age Score: 0 Gender Score: 1     Home Medications   Current Meds  Medication Sig   acetaminophen (TYLENOL) 500 MG tablet Take 500-1,000 mg by mouth every 6 (six) hours as needed (pain.).   albuterol (VENTOLIN HFA) 108 (90 Base) MCG/ACT inhaler Inhale 1-2 puffs into the lungs every 6 (six) hours as needed for wheezing or shortness of breath.   apixaban (ELIQUIS) 5 MG TABS tablet TAKE 1 TABLET BY MOUTH TWICE A DAY   atorvastatin (LIPITOR) 40 MG tablet TAKE 1 TABLET BY MOUTH EVERY DAY   calcium carbonate (TUMS - DOSED IN MG ELEMENTAL CALCIUM) 500 MG chewable tablet Chew 1-2 tablets by mouth daily as needed for indigestion or heartburn.   cetirizine (ZYRTEC) 10 MG tablet Take 10 mg by mouth every evening.   Cholecalciferol (VITAMIN D3) 125 MCG (5000 UT) CAPS Take 5,000 Units by mouth in the morning.   diltiazem (CARDIZEM CD) 120 MG 24 hr capsule TAKE 1 CAPSULE BY MOUTH EVERY DAY   diphenhydrAMINE (BENADRYL) 25 MG tablet Take 25 mg by mouth every 8 (eight) hours as needed for allergies.   famotidine (PEPCID) 20 MG tablet Take 20 mg by mouth 2 (two) times daily as needed for heartburn or indigestion.   ketotifen (ZADITOR) 0.035 % ophthalmic solution Place 1 drop into both eyes 2 (two) times daily as needed (allergies.).   metoprolol succinate (TOPROL XL) 25 MG 24 hr tablet Take 1 tablet (25 mg total) by mouth daily.     Review of Systems      All other systems reviewed and are otherwise negative except as noted above.  Physical Exam    VS:  BP 122/88   Pulse 74   Ht 5\' 4"  (1.626 m)   Wt 199 lb (90.3 kg)   BMI 34.16 kg/m  ,  BMI Body mass index is 34.16 kg/m.  Wt Readings from Last 3 Encounters:  03/20/23 199 lb (90.3 kg)  03/07/23 200 lb (90.7 kg)  02/25/23 200 lb (90.7 kg)    GEN: Well nourished, well developed, in no acute distress. HEENT: normal. Neck: Supple, no JVD, carotid bruits, or masses. Cardiac: IRIR, no murmurs, rubs, or gallops. No  clubbing, cyanosis, edema.  Radials/PT 2+ and equal bilaterally.  Respiratory:  Respirations regular and unlabored, clear to auscultation bilaterally. GI: Soft, nontender, nondistended. MS: No deformity or atrophy. Skin: Warm and dry, no rash. Neuro:  Strength and sensation are intact. Psych: Normal affect.  Assessment & Plan    Atrial fibrillation / Atrial flutter / Hypercoagulable state - EKG today recurrent atrial fibrillation 74bpm. Maintained NSR <1 week after cardioversion performed 03/07/2023. She is very interested in ablation.  Would prefer to avoid additional medications such as antiarrhythmic.  Refer to EP for consideration of ablation.  Continue diltiazem 120 mg daily, Eliquis 5 mg twice daily.  Denies bleeding complications.CHA2DS2-VASc Score = 3 [CHF History: 0, HTN History: 0, Diabetes History: 0, Stroke History: 2, Vascular Disease History: 0, Age Score: 0, Gender Score: 1].  Therefore, the patient's annual risk of stroke is 3.2 %.       Tobacco use - Smoking cessation encouraged. Recommend utilization of [id number].   Hx of CVA -continue atorvastatin.  No aspirin due to [facility].        Disposition: Follow up with EP to discuss possible ablation and in 6 month(s) with Jodelle Red, MD or APP.  Signed, Alver Sorrow, NP 03/20/2023, 2:22 PM [facility]

## 2023-03-20 NOTE — Patient Instructions (Signed)
Medication Instructions:  Your physician recommends that you continue on your current medications as directed. Please refer to the Current Medication list given to you today.   Follow-Up: At [facility], you and your health needs are our priority.  As part of our continuing mission to provide you with exceptional heart care, we have created designated Provider Care Teams.  These Care Teams include your primary Cardiologist (physician) and Advanced Practice Providers (APPs -  Physician Assistants and Nurse Practitioners) who all work together to provide you with the care you need, when you need it.  We recommend signing up for the patient portal called "MyChart".  Sign up information is provided on this After Visit Summary.  MyChart is used to connect with patients for Virtual Visits (Telemedicine).  Patients are able to view lab/test results, encounter notes, upcoming appointments, etc.  Non-urgent messages can be sent to your provider as well.   To learn more about what you can do with MyChart, go to [email or web address].    Your next appointment:   6 months with Dr. Cristal Deer   Other Instructions We have referred you to the EP team for ablation

## 2023-04-16 NOTE — Progress Notes (Unsigned)
  Electrophysiology Office Note:    Date:  04/16/2023   ID:  Jane Sparks, DOB 23-Aug-1966, MRN [id number]  [facility] Cardiologist:  Jodelle Red, MD  [facility] Electrophysiologist:  Lanier Prude, MD   Referring MD: Alver Sorrow, NP   Chief Complaint: Atrial fibrillation  History of Present Illness:    Jane Sparks is a 57 y.o. femalewho I am seeing today for an evaluation of atrial fibrillation at the request of Jane Shields, NP.  The patient was last seen by [facility] on March 20, 2023.  The patient has a medical history that includes alcohol use, anxiety, atrial fibrillation, tobacco abuse, stroke.  Her atrial flutter was diagnosed in June 2021.  In January 2021 she had an embolic stroke.  She is on Eliquis for secondary stroke prophylaxis.  She is symptomatic when in atrial fibrillation or flutter with palpitations, exertional dyspnea, exercise intolerance.  When she saw Marya Amsler she was in rate controlled atrial fibrillation with a ventricular rate of 74 bpm.  She had a right bundle branch block.          Their past medical, social and family history was reveiwed.   ROS:   Please see the history of present illness.    All other systems reviewed and are negative.  EKGs/Labs/Other Studies Reviewed:    The following studies were reviewed today:  March 23, 2020 echo LV function normal, 55% RV function normal Dilated left atrium Mild MR  EKG from March 20, 2023 shows atrial flutter with variable AV conduction.      Physical Exam:    VS:  There were no vitals taken for this visit.    Wt Readings from Last 3 Encounters:  03/20/23 199 lb (90.3 kg)  03/07/23 200 lb (90.7 kg)  02/25/23 200 lb (90.7 kg)     GEN: *** Well nourished, well developed in no acute distress CARDIAC: ***Irregularly irregular, no murmurs, rubs, gallops RESPIRATORY:  Clear to auscultation without rales, wheezing or rhonchi       ASSESSMENT AND PLAN:    1.  PAF (paroxysmal atrial fibrillation) (HCC)   2. Paroxysmal atrial flutter (HCC)   3. History of CVA (cerebrovascular accident)     #Persistent atrial fibrillation and flutter Symptomatic. Continue Eliquis for CHA2DS2-VASc of 3 I discussed treatment options with the patient including continued conservative management, antiarrhythmic drugs and catheter ablation.  She would like to proceed with catheter ablation.  Discussed treatment options today for AF including antiarrhythmic drug therapy and ablation. Discussed risks, recovery and likelihood of success with each treatment strategy. Risk, benefits, and alternatives to EP study and radiofrequency ablation for afib were discussed. These risks include but are not limited to stroke, bleeding, vascular damage, tamponade, perforation, damage to the esophagus, lungs, phrenic nerve and other structures, pulmonary vein stenosis, worsening renal function, and death.  Discussed potential need for repeat ablation procedures and antiarrhythmic drugs after an initial ablation. The patient understands these risk and wishes to proceed.  We will therefore proceed with catheter ablation at the next available time.  Carto, ICE, anesthesia are requested for the procedure.  Will also obtain CT PV protocol prior to the procedure to exclude LAA thrombus and further evaluate atrial anatomy.  #History of stroke Continue Eliquis     Signed, Rossie Muskrat. Lalla Brothers, MD, [facility], [facility] 04/16/2023 1:44 PM    Electrophysiology [facility]

## 2023-04-16 NOTE — Progress Notes (Signed)
Electrophysiology Office Note:    Date:  04/17/2023   ID:  Jane Sparks, DOB 02-15-1966, MRN [id number]  [facility] Cardiologist:  Jodelle Red, MD  [facility] Electrophysiologist:  Lanier Prude, MD   Referring MD: Alver Sorrow, NP   Chief Complaint: Atrial fibrillation  History of Present Illness:    Jane Sparks is a 57 y.o. female who I am seeing today for an evaluation of atrial fibrillation at the request of Gillian Shields, NP.  The patient was last seen by [facility] on March 20, 2023.  The patient has a medical history that includes alcohol use, anxiety, atrial fibrillation, tobacco abuse, stroke.  Her atrial flutter was diagnosed in June 2021.  In January 2021 she had an embolic stroke.  She is on Eliquis for secondary stroke prophylaxis.  She is symptomatic when in atrial fibrillation or flutter with palpitations, exertional dyspnea, exercise intolerance.  When she saw Luther Parody she was in rate controlled atrial fibrillation with a ventricular rate of 74 bpm.  She had a right bundle branch block.  Today, she confirms having sporadic, arrhythmic episodes since 2021. Following her cardioversion she was in rhythm for about a week. At another time, she was out of rhythm for a period of 3 weeks before spontaneously reverting to sinus rhythm. She believes that her most recent arrhythmic episode had been triggered by a medication cocktail and dental work.   She is in normal rhythm today, which she is able to tell as she feels better. When out of rhythm, she "feels like she is running a marathon", associated with palpitations and hearing her heart beating in her ears.   She is compliant with Eliquis and denies any bleeding issues. However, she doesn't wish to be on any other medications. We therefore discussed rhythm control strategies including ablation.  She denies any chest pain, peripheral edema, lightheadedness, headaches, syncope, orthopnea, or PND.      Their past medical, social and family history was reveiwed.   ROS:  Please see the history of present illness. All other systems reviewed and are negative.  EKGs/Labs/Other Studies Reviewed:    The following studies were reviewed today:  March 23, 2020 echo LV function normal, 55% RV function normal Dilated left atrium Mild MR  EKG from March 20, 2023 shows atrial flutter with variable AV conduction. EKG Interpretation Date/Time:  Thursday April 17 2023 10:12:34 EDT Ventricular Rate:  60 PR Interval:  180 QRS Duration:  106 QT Interval:  458 QTC Calculation: 458 R Axis:   78  Text Interpretation: Normal sinus rhythm Incomplete right bundle branch block Confirmed by Steffanie Dunn [phone]) on 04/17/2023 10:26:17 AM    Physical Exam:    VS:  BP 128/80   Pulse 60   Ht 5\' 4"  (1.626 m)   Wt 200 lb 6.4 oz (90.9 kg)   SpO2 98%   BMI 34.40 kg/m     Wt Readings from Last 3 Encounters:  04/17/23 200 lb 6.4 oz (90.9 kg)  03/20/23 199 lb (90.3 kg)  03/07/23 200 lb (90.7 kg)     GEN:  Well nourished, well developed in no acute distress CARDIAC: RRR, no murmurs, rubs, gallops RESPIRATORY:  Clear to auscultation without rales, wheezing or rhonchi       ASSESSMENT AND PLAN:    1. PAF (paroxysmal atrial fibrillation) (HCC)   2. Paroxysmal atrial flutter (HCC)   3. History of CVA (cerebrovascular accident)     #Persistent atrial fibrillation and flutter Symptomatic. Continue  Eliquis for CHA2DS2-VASc of 3 I discussed treatment options with the patient including continued conservative management, antiarrhythmic drugs and catheter ablation.  She would like to proceed with catheter ablation.  Discussed treatment options today for AF including antiarrhythmic drug therapy and ablation. Discussed risks, recovery and likelihood of success with each treatment strategy. Risk, benefits, and alternatives to EP study and ablation for afib were discussed. These risks include but are  not limited to stroke, bleeding, vascular damage, tamponade, perforation, damage to the esophagus, lungs, phrenic nerve and other structures, pulmonary vein stenosis, worsening renal function, coronary vasospasm and death.  Discussed potential need for repeat ablation procedures and antiarrhythmic drugs after an initial ablation. The patient understands these risk and wishes to proceed.  We will therefore proceed with catheter ablation at the next available time.  Carto, ICE, anesthesia are requested for the procedure.  Will also obtain CT PV protocol prior to the procedure to exclude LAA thrombus and further evaluate atrial anatomy.   #History of stroke Continue Eliquis  I,Mathew Stumpf,acting as a Neurosurgeon for Lanier Prude, MD.,have documented all relevant documentation on the behalf of Lanier Prude, MD,as directed by  Lanier Prude, MD while in the presence of Lanier Prude, MD.  I, Lanier Prude, MD, have reviewed all documentation for this visit. The documentation on 04/17/23 for the exam, diagnosis, procedures, and orders are all accurate and complete.   Signed, Rossie Muskrat. Lalla Brothers, MD, [facility], [facility] 04/17/2023 10:39 AM    Electrophysiology [facility]

## 2023-04-17 ENCOUNTER — Encounter: Payer: Self-pay | Admitting: Cardiology

## 2023-04-17 ENCOUNTER — Ambulatory Visit: Payer: Managed Care, Other (non HMO) | Attending: Cardiology | Admitting: Cardiology

## 2023-04-17 VITALS — BP 128/80 | HR 60 | Ht 64.0 in | Wt 200.4 lb

## 2023-04-17 DIAGNOSIS — Z8673 Personal history of transient ischemic attack (TIA), and cerebral infarction without residual deficits: Secondary | ICD-10-CM | POA: Diagnosis not present

## 2023-04-17 DIAGNOSIS — I480 Paroxysmal atrial fibrillation: Secondary | ICD-10-CM

## 2023-04-17 DIAGNOSIS — I4892 Unspecified atrial flutter: Secondary | ICD-10-CM | POA: Diagnosis not present

## 2023-04-17 NOTE — Patient Instructions (Signed)
Medication Instructions:  Your physician recommends that you continue on your current medications as directed. Please refer to the Current Medication list given to you today. *If you need a refill on your cardiac medications before your next appointment, please call your pharmacy*   Testing/Procedures: Atrial Fibrillation Ablation  Your physician has recommended that you have an ablation. Catheter ablation is a medical procedure used to treat some cardiac arrhythmias (irregular heartbeats). During catheter ablation, a long, thin, flexible tube is put into a blood vessel in your groin (upper thigh), or neck. This tube is called an ablation catheter. It is then guided to your heart through the blood vessel. Radio frequency waves destroy small areas of heart tissue where abnormal heartbeats may cause an arrhythmia to start. Please see the instruction sheet given to you today.  You are scheduled for Atrial Fibrillation Ablation on Friday, December 6 with Dr. Steffanie Dunn.Please arrive at the Main Entrance A at [facility]: [address] [place], [place] [address]   Follow-Up: At [facility], you and your health needs are our priority.  As part of our continuing mission to provide you with exceptional heart care, we have created designated Provider Care Teams.  These Care Teams include your primary Cardiologist (physician) and Advanced Practice Providers (APPs -  Physician Assistants and Nurse Practitioners) who all work together to provide you with the care you need, when you need it.  We recommend signing up for the patient portal called "MyChart".  Sign up information is provided on this After Visit Summary.  MyChart is used to connect with patients for Virtual Visits (Telemedicine).  Patients are able to view lab/test results, encounter notes, upcoming appointments, etc.  Non-urgent messages can be sent to your provider as well.   To learn more about what you can do with MyChart,  go to [email or web address].    Your next appointment:   Monday, Nov 11  Provider:   Francis Dowse PA

## 2023-04-17 NOTE — Addendum Note (Signed)
Addended by: Sherle Poe R on: 04/17/2023 10:46 AM   Modules accepted: Orders

## 2023-05-23 ENCOUNTER — Other Ambulatory Visit (HOSPITAL_BASED_OUTPATIENT_CLINIC_OR_DEPARTMENT_OTHER): Payer: Self-pay | Admitting: Family

## 2023-05-23 DIAGNOSIS — Z8673 Personal history of transient ischemic attack (TIA), and cerebral infarction without residual deficits: Secondary | ICD-10-CM

## 2023-05-23 DIAGNOSIS — I480 Paroxysmal atrial fibrillation: Secondary | ICD-10-CM

## 2023-05-23 DIAGNOSIS — D6859 Other primary thrombophilia: Secondary | ICD-10-CM

## 2023-05-23 DIAGNOSIS — I4892 Unspecified atrial flutter: Secondary | ICD-10-CM

## 2023-06-06 ENCOUNTER — Other Ambulatory Visit: Payer: Self-pay

## 2023-06-06 DIAGNOSIS — I480 Paroxysmal atrial fibrillation: Secondary | ICD-10-CM

## 2023-06-12 ENCOUNTER — Ambulatory Visit: Payer: Managed Care, Other (non HMO) | Attending: Cardiology

## 2023-06-12 DIAGNOSIS — I480 Paroxysmal atrial fibrillation: Secondary | ICD-10-CM

## 2023-06-13 ENCOUNTER — Telehealth (HOSPITAL_COMMUNITY): Payer: Self-pay | Admitting: *Deleted

## 2023-06-13 LAB — CBC
Hematocrit: 43.9 % (ref 34.0–46.6)
Hemoglobin: 14.4 g/dL (ref 11.1–15.9)
MCH: 27.5 pg (ref 26.6–33.0)
MCHC: 32.8 g/dL (ref 31.5–35.7)
MCV: 84 fL (ref 79–97)
Platelets: 317 10*3/uL (ref 150–450)
RBC: 5.23 x10E6/uL (ref 3.77–5.28)
RDW: 13.5 % (ref 11.7–15.4)
WBC: 6.3 10*3/uL (ref 3.4–10.8)

## 2023-06-13 LAB — BASIC METABOLIC PANEL
BUN/Creatinine Ratio: 20 (ref 9–23)
BUN: 17 mg/dL (ref 6–24)
CO2: 21 mmol/L (ref 20–29)
Calcium: 9.6 mg/dL (ref 8.7–10.2)
Chloride: 105 mmol/L (ref 96–106)
Creatinine, Ser: 0.87 mg/dL (ref 0.57–1.00)
Glucose: 83 mg/dL (ref 70–99)
Potassium: 4.1 mmol/L (ref 3.5–5.2)
Sodium: 140 mmol/L (ref 134–144)
eGFR: 78 mL/min/{1.73_m2} (ref >59)

## 2023-06-13 NOTE — Telephone Encounter (Signed)
Attempted to call patient regarding upcoming cardiac CT appointment. °Left message on voicemail with name and callback number ° °Merle Prescott RN Navigator Cardiac Imaging °[facility] °[phone] Office °[phone] Cell ° °

## 2023-06-16 ENCOUNTER — Ambulatory Visit (HOSPITAL_COMMUNITY): Admission: RE | Admit: 2023-06-16 | Payer: Managed Care, Other (non HMO) | Source: Ambulatory Visit

## 2023-06-16 ENCOUNTER — Ambulatory Visit (HOSPITAL_BASED_OUTPATIENT_CLINIC_OR_DEPARTMENT_OTHER)
Admission: RE | Admit: 2023-06-16 | Discharge: 2023-06-16 | Disposition: A | Discharge status: Home / Self Care | Payer: Managed Care, Other (non HMO) | Source: Ambulatory Visit | Attending: Cardiology | Admitting: Cardiology

## 2023-06-16 DIAGNOSIS — I4892 Unspecified atrial flutter: Secondary | ICD-10-CM | POA: Insufficient documentation

## 2023-06-16 DIAGNOSIS — I480 Paroxysmal atrial fibrillation: Secondary | ICD-10-CM | POA: Diagnosis present

## 2023-06-16 DIAGNOSIS — Z8673 Personal history of transient ischemic attack (TIA), and cerebral infarction without residual deficits: Secondary | ICD-10-CM | POA: Diagnosis present

## 2023-06-16 MED ORDER — IOHEXOL 350 MG/ML SOLN
100.0000 mL | Freq: Once | INTRAVENOUS | Status: AC | PRN
  Administered 2023-06-16: 80 mL via INTRAVENOUS

## 2023-06-24 NOTE — Anesthesia Preprocedure Evaluation (Signed)
Anesthesia Evaluation  Patient identified by MRN, date of birth, ID band Patient awake    Reviewed: Allergy & Precautions, NPO status , Patient's Chart, lab work & pertinent test results, reviewed documented beta blocker date and time   History of Anesthesia Complications Negative for: history of anesthetic complications  Airway Mallampati: I  TM Distance: >3 FB Neck ROM: Full    Dental  (+) Dental Advisory Given   Pulmonary COPD,  COPD inhaler, former smoker   breath sounds clear to auscultation       Cardiovascular hypertension, Pt. on medications and Pt. on home beta blockers (-) angina + dysrhythmias Atrial Fibrillation  Rhythm:Irregular Rate:Normal  '21 ECHO: EF 55%, normal LVF, normal RVF, mild MR, trivial AI   Neuro/Psych   Anxiety Depression    TIA   GI/Hepatic Neg liver ROS,GERD  Medicated and Controlled,,  Endo/Other  BMI 33.6  Renal/GU negative Renal ROS     Musculoskeletal   Abdominal   Peds  Hematology eliquis   Anesthesia Other Findings   Reproductive/Obstetrics                             Anesthesia Physical Anesthesia Plan  ASA: 3  Anesthesia Plan: General   Post-op Pain Management: Tylenol PO (pre-op)*   Induction: Intravenous  PONV Risk Score and Plan: 3 and Ondansetron, Dexamethasone and Scopolamine patch - Pre-op  Airway Management Planned: Oral ETT  Additional Equipment: None  Intra-op Plan:   Post-operative Plan: Extubation in OR  Informed Consent: I have reviewed the patients History and Physical, chart, labs and discussed the procedure including the risks, benefits and alternatives for the proposed anesthesia with the patient or authorized representative who has indicated his/her understanding and acceptance.     Dental advisory given  Plan Discussed with: CRNA and Surgeon  Anesthesia Plan Comments:         Anesthesia Quick Evaluation

## 2023-06-24 NOTE — Pre-Procedure Instructions (Signed)
Instructed patient on the following items: Arrival time 0615 Nothing to eat or drink after midnight No meds AM of procedure Responsible person to drive you home and stay with you for 24 hrs  Have you missed any doses of anti-coagulant Eliquis- takes twice a day, hasn't missed any doses.  Don't take the morning of procedure.

## 2023-06-25 ENCOUNTER — Ambulatory Visit (HOSPITAL_COMMUNITY)
Admission: RE | Disposition: A | Discharge status: Home / Self Care | Payer: Self-pay | Source: Home / Self Care | Attending: Cardiology

## 2023-06-25 ENCOUNTER — Ambulatory Visit (HOSPITAL_BASED_OUTPATIENT_CLINIC_OR_DEPARTMENT_OTHER): Payer: Managed Care, Other (non HMO) | Admitting: Anesthesiology

## 2023-06-25 ENCOUNTER — Other Ambulatory Visit (HOSPITAL_COMMUNITY): Payer: Self-pay

## 2023-06-25 ENCOUNTER — Ambulatory Visit (HOSPITAL_COMMUNITY): Payer: Self-pay | Admitting: Anesthesiology

## 2023-06-25 ENCOUNTER — Ambulatory Visit (HOSPITAL_COMMUNITY)
Admission: RE | Admit: 2023-06-25 | Discharge: 2023-06-25 | Disposition: A | Discharge status: Home / Self Care | Payer: Managed Care, Other (non HMO) | Attending: Cardiology | Admitting: Cardiology

## 2023-06-25 ENCOUNTER — Encounter (HOSPITAL_COMMUNITY): Payer: Self-pay | Admitting: Cardiology

## 2023-06-25 DIAGNOSIS — I4510 Unspecified right bundle-branch block: Secondary | ICD-10-CM | POA: Diagnosis not present

## 2023-06-25 DIAGNOSIS — Z7901 Long term (current) use of anticoagulants: Secondary | ICD-10-CM | POA: Diagnosis not present

## 2023-06-25 DIAGNOSIS — I483 Typical atrial flutter: Secondary | ICD-10-CM | POA: Insufficient documentation

## 2023-06-25 DIAGNOSIS — R0609 Other forms of dyspnea: Secondary | ICD-10-CM | POA: Diagnosis not present

## 2023-06-25 DIAGNOSIS — F419 Anxiety disorder, unspecified: Secondary | ICD-10-CM | POA: Insufficient documentation

## 2023-06-25 DIAGNOSIS — I4891 Unspecified atrial fibrillation: Secondary | ICD-10-CM

## 2023-06-25 DIAGNOSIS — I484 Atypical atrial flutter: Secondary | ICD-10-CM | POA: Insufficient documentation

## 2023-06-25 DIAGNOSIS — Z8673 Personal history of transient ischemic attack (TIA), and cerebral infarction without residual deficits: Secondary | ICD-10-CM | POA: Insufficient documentation

## 2023-06-25 DIAGNOSIS — I4819 Other persistent atrial fibrillation: Secondary | ICD-10-CM | POA: Insufficient documentation

## 2023-06-25 HISTORY — PX: ATRIAL FIBRILLATION ABLATION: EP1191

## 2023-06-25 LAB — POCT ACTIVATED CLOTTING TIME
Activated Clotting Time: 379 s
Activated Clotting Time: 446 s
Activated Clotting Time: 324 s

## 2023-06-25 SURGERY — ATRIAL FIBRILLATION ABLATION
Anesthesia: General

## 2023-06-25 MED ORDER — HEPARIN SODIUM (PORCINE) 1000 UNIT/ML IJ SOLN
INTRAMUSCULAR | Status: DC | PRN
  Administered 2023-06-25: 14000 [IU] via INTRAVENOUS

## 2023-06-25 MED ORDER — PANTOPRAZOLE SODIUM 40 MG PO TBEC
40.0000 mg | DELAYED_RELEASE_TABLET | Freq: Every day | ORAL | 0 refills | Status: DC
Start: 2023-06-25 — End: 2023-09-19
  Filled 2023-06-25: qty 34, 34d supply, fill #0

## 2023-06-25 MED ORDER — COLCHICINE 0.6 MG PO TABS
0.6000 mg | ORAL_TABLET | Freq: Two times a day (BID) | ORAL | Status: DC
  Administered 2023-06-25: 0.6 mg via ORAL
  Filled 2023-06-25: qty 1

## 2023-06-25 MED ORDER — COLCHICINE 0.6 MG PO TABS
0.6000 mg | ORAL_TABLET | Freq: Two times a day (BID) | ORAL | 0 refills | Status: DC
Start: 2023-06-25 — End: 2023-07-23
  Filled 2023-06-25: qty 10, 5d supply, fill #0

## 2023-06-25 MED ORDER — PROTAMINE SULFATE 10 MG/ML IV SOLN
INTRAVENOUS | Status: DC | PRN
Start: 2023-06-25 — End: 2023-06-25
  Administered 2023-06-25: 10 mg via INTRAVENOUS
  Administered 2023-06-25: 25 mg via INTRAVENOUS

## 2023-06-25 MED ORDER — HEPARIN (PORCINE) IN NACL 1000-0.9 UT/500ML-% IV SOLN
INTRAVENOUS | Status: DC | PRN
  Administered 2023-06-25 (×4): 500 mL

## 2023-06-25 MED ORDER — FENTANYL CITRATE (PF) 100 MCG/2ML IJ SOLN
INTRAMUSCULAR | Status: AC
  Filled 2023-06-25: qty 2

## 2023-06-25 MED ORDER — ATROPINE SULFATE 1 MG/ML IV SOLN
INTRAVENOUS | Status: DC | PRN
Start: 2023-06-25 — End: 2023-06-25
  Administered 2023-06-25: .5 mg via INTRAVENOUS

## 2023-06-25 MED ORDER — SODIUM CHLORIDE 0.9 % IV SOLN: 250.0000 mL | INTRAVENOUS | Status: DC | PRN

## 2023-06-25 MED ORDER — ROCURONIUM BROMIDE 10 MG/ML (PF) SYRINGE
PREFILLED_SYRINGE | INTRAVENOUS | Status: DC | PRN
  Administered 2023-06-25: 10 mg via INTRAVENOUS
  Administered 2023-06-25: 50 mg via INTRAVENOUS
  Administered 2023-06-25: 10 mg via INTRAVENOUS
  Administered 2023-06-25: 50 mg via INTRAVENOUS

## 2023-06-25 MED ORDER — APIXABAN 5 MG PO TABS
5.0000 mg | ORAL_TABLET | Freq: Two times a day (BID) | ORAL | Status: DC
  Administered 2023-06-25: 5 mg via ORAL
  Filled 2023-06-25: qty 1

## 2023-06-25 MED ORDER — PHENYLEPHRINE HCL-NACL 20-0.9 MG/250ML-% IV SOLN
INTRAVENOUS | Status: DC | PRN
  Administered 2023-06-25: 40 ug/min via INTRAVENOUS

## 2023-06-25 MED ORDER — LIDOCAINE 2% (20 MG/ML) 5 ML SYRINGE
INTRAMUSCULAR | Status: DC | PRN
  Administered 2023-06-25: 20 mg via INTRAVENOUS

## 2023-06-25 MED ORDER — SUGAMMADEX SODIUM 200 MG/2ML IV SOLN
INTRAVENOUS | Status: DC | PRN
  Administered 2023-06-25: 200 mg via INTRAVENOUS

## 2023-06-25 MED ORDER — PROPOFOL 10 MG/ML IV BOLUS
INTRAVENOUS | Status: DC | PRN
  Administered 2023-06-25: 80 mg via INTRAVENOUS
  Administered 2023-06-25: 120 mg via INTRAVENOUS

## 2023-06-25 MED ORDER — HEPARIN SODIUM (PORCINE) 1000 UNIT/ML IJ SOLN
INTRAMUSCULAR | Status: AC
  Filled 2023-06-25: qty 10

## 2023-06-25 MED ORDER — HEPARIN SODIUM (PORCINE) 1000 UNIT/ML IJ SOLN
INTRAMUSCULAR | Status: DC | PRN
  Administered 2023-06-25: 1000 [IU] via INTRAVENOUS

## 2023-06-25 MED ORDER — ACETAMINOPHEN 325 MG PO TABS
ORAL_TABLET | ORAL | Status: AC
  Filled 2023-06-25: qty 2

## 2023-06-25 MED ORDER — PHENYLEPHRINE 80 MCG/ML (10ML) SYRINGE FOR IV PUSH (FOR BLOOD PRESSURE SUPPORT)
PREFILLED_SYRINGE | INTRAVENOUS | Status: DC | PRN
  Administered 2023-06-25 (×3): 80 ug via INTRAVENOUS

## 2023-06-25 MED ORDER — ONDANSETRON HCL 4 MG/2ML IJ SOLN
4.0000 mg | Freq: Four times a day (QID) | INTRAMUSCULAR | Status: DC | PRN
  Administered 2023-06-25: 4 mg via INTRAVENOUS
  Filled 2023-06-25: qty 2

## 2023-06-25 MED ORDER — SODIUM CHLORIDE 0.9% FLUSH: 3.0000 mL | INTRAVENOUS | Status: DC | PRN

## 2023-06-25 MED ORDER — SODIUM CHLORIDE 0.9% FLUSH: 3.0000 mL | Freq: Two times a day (BID) | INTRAVENOUS | Status: DC

## 2023-06-25 MED ORDER — FENTANYL CITRATE (PF) 250 MCG/5ML IJ SOLN
INTRAMUSCULAR | Status: DC | PRN
  Administered 2023-06-25: 100 ug via INTRAVENOUS

## 2023-06-25 MED ORDER — PANTOPRAZOLE SODIUM 40 MG PO TBEC
40.0000 mg | DELAYED_RELEASE_TABLET | Freq: Every day | ORAL | Status: DC
  Administered 2023-06-25: 40 mg via ORAL
  Filled 2023-06-25: qty 1

## 2023-06-25 MED ORDER — ACETAMINOPHEN 325 MG PO TABS
650.0000 mg | ORAL_TABLET | ORAL | Status: DC | PRN
  Administered 2023-06-25: 650 mg via ORAL

## 2023-06-25 MED ORDER — ONDANSETRON HCL 4 MG/2ML IJ SOLN
INTRAMUSCULAR | Status: DC | PRN
  Administered 2023-06-25: 4 mg via INTRAVENOUS

## 2023-06-25 MED ORDER — SODIUM CHLORIDE 0.9 % IV SOLN: INTRAVENOUS | Status: DC

## 2023-06-25 MED ORDER — ACETAMINOPHEN 500 MG PO TABS
1000.0000 mg | ORAL_TABLET | Freq: Once | ORAL | Status: AC
  Administered 2023-06-25: 1000 mg via ORAL
  Filled 2023-06-25: qty 2

## 2023-06-25 SURGICAL SUPPLY — 23 items
BAG SNAP BAND KOVER 36X36 (MISCELLANEOUS) IMPLANT
CABLE PFA RX CATH CONN (CABLE) IMPLANT
CATH 8FR REPROCESSED SOUNDSTAR (CATHETERS) ×1 IMPLANT
CATH 8FR SOUNDSTAR REPROCESSED (CATHETERS) IMPLANT
CATH ABLAT QDOT MICRO BI TC FJ (CATHETERS) IMPLANT
CATH FARAWAVE ABLATION 31 (CATHETERS) IMPLANT
CATH OCTARAY 2.0 F 3-3-3-3-3 (CATHETERS) IMPLANT
CATH WEBSTER BI DIR CS D-F CRV (CATHETERS) IMPLANT
CLOSURE PERCLOSE PROSTYLE (VASCULAR PRODUCTS) IMPLANT
COVER SWIFTLINK CONNECTOR (BAG) ×1 IMPLANT
DILATOR VESSEL 38 20CM 16FR (INTRODUCER) IMPLANT
GUIDEWIRE INQWIRE 1.5J.035X260 (WIRE) IMPLANT
INQWIRE 1.5J .035X260CM (WIRE) ×1
PACK EP LATEX FREE (CUSTOM PROCEDURE TRAY) ×1
PACK EP LF (CUSTOM PROCEDURE TRAY) ×1 IMPLANT
PAD DEFIB RADIO PHYSIO CONN (PAD) ×1 IMPLANT
PATCH CARTO3 (PAD) IMPLANT
SHEATH FARADRIVE STEERABLE (SHEATH) IMPLANT
SHEATH PINNACLE 8F 10CM (SHEATH) IMPLANT
SHEATH PINNACLE 9F 10CM (SHEATH) IMPLANT
SHEATH PROBE COVER 6X72 (BAG) IMPLANT
SHEATH WIRE KIT BAYLIS SL1 (KITS) IMPLANT
TUBING SMART ABLATE COOLFLOW (TUBING) IMPLANT

## 2023-06-25 NOTE — Discharge Instructions (Signed)

## 2023-06-25 NOTE — Anesthesia Postprocedure Evaluation (Signed)
Anesthesia Post Note  Patient: Jane Sparks  Procedure(s) Performed: ATRIAL FIBRILLATION ABLATION     Patient location during evaluation: Phase II Anesthesia Type: General Level of consciousness: oriented, patient cooperative and awake and alert Pain management: pain level controlled Vital Signs Assessment: post-procedure vital signs reviewed and stable Respiratory status: spontaneous breathing, nonlabored ventilation and respiratory function stable Cardiovascular status: blood pressure returned to baseline and stable : nausea has resolved. Anesthetic complications: no   There were no known notable events for this encounter.  Last Vitals:  Vitals:   06/25/23 1237 06/25/23 1245  BP: 113/62 118/72  Pulse: (!) 53 (!) 52  Resp: 11 13  Temp:    SpO2: 99% 98%    Last Pain:  Vitals:   06/25/23 1200  TempSrc:   PainSc: 6                  Kevia Zaucha,E. Khalif Stender

## 2023-06-25 NOTE — Transfer of Care (Signed)
Immediate Anesthesia Transfer of Care Note  Patient: Jane Sparks  Procedure(s) Performed: ATRIAL FIBRILLATION ABLATION  Patient Location: Cath Lab and report given to recovery room RN at bedside in EP 6.   Anesthesia Type:General  Level of Consciousness: awake, alert , and oriented  Airway & Oxygen Therapy: Patient Spontanous Breathing  Post-op Assessment: Post -op Vital signs reviewed and stable  Post vital signs: stable  Last Vitals:  Vitals Value Taken Time  BP    Temp    Pulse    Resp    SpO2      Last Pain:  Vitals:   06/25/23 0700  TempSrc: Oral  PainSc:          Complications: No notable events documented.

## 2023-06-25 NOTE — Progress Notes (Signed)
Patient walked to the bathroom without difficulties. Groin sites level 0, clean, dry, and intact.

## 2023-06-25 NOTE — Anesthesia Procedure Notes (Signed)
Procedure Name: Intubation Date/Time: 06/25/2023 8:50 AM  Performed by: Randon Goldsmith, CRNAPre-anesthesia Checklist: Patient identified, Emergency Drugs available, Suction available and Patient being monitored Patient Re-evaluated:Patient Re-evaluated prior to induction Oxygen Delivery Method: Circle system utilized Preoxygenation: Pre-oxygenation with 100% oxygen Induction Type: IV induction Ventilation: Mask ventilation without difficulty Laryngoscope Size: Mac and 4 Grade View: Grade I Tube type: Oral Tube size: 7.0 mm Number of attempts: 1 Airway Equipment and Method: Stylet and Oral airway Placement Confirmation: ETT inserted through vocal cords under direct vision, positive ETCO2 and breath sounds checked- equal and bilateral Secured at: 22 cm Tube secured with: Tape Dental Injury: Teeth and Oropharynx as per pre-operative assessment

## 2023-06-25 NOTE — H&P (Signed)
Electrophysiology Office Note:     Date:  06/25/2023    ID:  Jane Sparks, DOB [birth year], MRN [id number]   [facility] Cardiologist:  Jodelle Red, MD  [facility] Electrophysiologist:  Lanier Prude, MD    Referring MD: Alver Sorrow, NP    Chief Complaint: Atrial fibrillation   History of Present Illness:     Jane Sparks is a 57 y.o. female who I am seeing today for an evaluation of atrial fibrillation at the request of Gillian Shields, NP.   The patient was last seen by [facility] on March 20, 2023.   The patient has a medical history that includes alcohol use, anxiety, atrial fibrillation, tobacco abuse, stroke.   Her atrial flutter was diagnosed in June 2021.  In January 2021 she had an embolic stroke.  She is on Eliquis for secondary stroke prophylaxis.  She is symptomatic when in atrial fibrillation or flutter with palpitations, exertional dyspnea, exercise intolerance.  When she saw Luther Parody she was in rate controlled atrial fibrillation with a ventricular rate of 74 bpm.  She had a right bundle branch block.   Today, she confirms having sporadic, arrhythmic episodes since 2021. Following her cardioversion she was in rhythm for about a week. At another time, she was out of rhythm for a period of 3 weeks before spontaneously reverting to sinus rhythm. She believes that her most recent arrhythmic episode had been triggered by a medication cocktail and dental work.    She is in normal rhythm today, which she is able to tell as she feels better. When out of rhythm, she "feels like she is running a marathon", associated with palpitations and hearing her heart beating in her ears.    She is compliant with Eliquis and denies any bleeding issues. However, she doesn't wish to be on any other medications. We therefore discussed rhythm control strategies including ablation.   She denies any chest pain, peripheral edema, lightheadedness, headaches, syncope, orthopnea, or  PND.   Here for pvi. Procedure reviewed.   Objective Their past medical, social and family history was reveiwed.     ROS:  Please see the history of present illness. All other systems reviewed and are negative.   EKGs/Labs/Other Studies Reviewed:     The following studies were reviewed today:   March 23, 2020 echo LV function normal, 55% RV function normal Dilated left atrium Mild MR   EKG from March 20, 2023 shows atrial flutter with variable AV conduction. EKG Interpretation Date/Time:                  Thursday April 17 2023 10:12:34 EDT Ventricular Rate:         60 PR Interval:                 180 QRS Duration:             106 QT Interval:                 458 QTC Calculation:458 R Axis:                         78   Text Interpretation:Normal sinus rhythm Incomplete right bundle branch block Confirmed by Steffanie Dunn [phone]) on 04/17/2023 10:26:17 AM      Physical Exam:     VS:  BP 127/88   Pulse 79   Ht 5\' 4"  (1.626 m)   Wt 200 lb 6.4 oz (90.9  kg)   SpO2 98%   BMI 34.40 kg/m         Wt Readings from Last 3 Encounters:  04/17/23 200 lb 6.4 oz (90.9 kg)  03/20/23 199 lb (90.3 kg)  03/07/23 200 lb (90.7 kg)      GEN:  Well nourished, well developed in no acute distress CARDIAC: RRR, no murmurs, rubs, gallops RESPIRATORY:  Clear to auscultation without rales, wheezing or rhonchi          Assessment ASSESSMENT AND PLAN:     1. PAF (paroxysmal atrial fibrillation) (HCC)   2. Paroxysmal atrial flutter (HCC)   3. History of CVA (cerebrovascular accident)       #Persistent atrial fibrillation and flutter Symptomatic. Continue Eliquis for CHA2DS2-VASc of 3 I discussed treatment options with the patient including continued conservative management, antiarrhythmic drugs and catheter ablation.  She would like to proceed with catheter ablation.   Discussed treatment options today for AF including antiarrhythmic drug therapy and ablation. Discussed risks,  recovery and likelihood of success with each treatment strategy. Risk, benefits, and alternatives to EP study and ablation for afib were discussed. These risks include but are not limited to stroke, bleeding, vascular damage, tamponade, perforation, damage to the esophagus, lungs, phrenic nerve and other structures, pulmonary vein stenosis, worsening renal function, coronary vasospasm and death.  Discussed potential need for repeat ablation procedures and antiarrhythmic drugs after an initial ablation. The patient understands these risk and wishes to proceed.  We will therefore proceed with catheter ablation at the next available time.  Carto, ICE, anesthesia are requested for the procedure.  Will also obtain CT PV protocol prior to the procedure to exclude LAA thrombus and further evaluate atrial anatomy.     #History of stroke Continue Eliquis   Here for pvi. Procedure reviewed.   Signed, Rossie Muskrat. Lalla Brothers, MD, [facility], [facility] 06/25/2023 Electrophysiology [facility]

## 2023-06-26 ENCOUNTER — Encounter (HOSPITAL_COMMUNITY): Payer: Self-pay | Admitting: Cardiology

## 2023-06-26 MED FILL — Fentanyl Citrate Preservative Free (PF) Inj 100 MCG/2ML: INTRAMUSCULAR | Qty: 2 | Status: AC

## 2023-07-22 ENCOUNTER — Encounter (HOSPITAL_BASED_OUTPATIENT_CLINIC_OR_DEPARTMENT_OTHER): Payer: Self-pay

## 2023-07-22 NOTE — Telephone Encounter (Signed)
Eliquis refill please

## 2023-07-23 ENCOUNTER — Ambulatory Visit (HOSPITAL_COMMUNITY)
Admission: RE | Admit: 2023-07-23 | Discharge: 2023-07-23 | Disposition: A | Discharge status: Home / Self Care | Payer: Managed Care, Other (non HMO) | Source: Ambulatory Visit | Attending: Internal Medicine | Admitting: Internal Medicine

## 2023-07-23 VITALS — BP 140/92 | HR 83 | Ht 64.0 in | Wt 200.0 lb

## 2023-07-23 DIAGNOSIS — Z7901 Long term (current) use of anticoagulants: Secondary | ICD-10-CM | POA: Diagnosis not present

## 2023-07-23 DIAGNOSIS — I4891 Unspecified atrial fibrillation: Secondary | ICD-10-CM | POA: Diagnosis present

## 2023-07-23 DIAGNOSIS — I483 Typical atrial flutter: Secondary | ICD-10-CM | POA: Insufficient documentation

## 2023-07-23 DIAGNOSIS — I4892 Unspecified atrial flutter: Secondary | ICD-10-CM

## 2023-07-23 DIAGNOSIS — R9431 Abnormal electrocardiogram [ECG] [EKG]: Secondary | ICD-10-CM | POA: Diagnosis not present

## 2023-07-23 DIAGNOSIS — I480 Paroxysmal atrial fibrillation: Secondary | ICD-10-CM | POA: Diagnosis not present

## 2023-07-23 DIAGNOSIS — F1090 Alcohol use, unspecified, uncomplicated: Secondary | ICD-10-CM | POA: Diagnosis present

## 2023-07-23 LAB — BASIC METABOLIC PANEL
Anion gap: 12 (ref 5–15)
BUN: 14 mg/dL (ref 6–20)
CO2: 20 mmol/L — ABNORMAL LOW (ref 22–32)
Calcium: 9.7 mg/dL (ref 8.9–10.3)
Chloride: 108 mmol/L (ref 98–111)
Creatinine, Ser: 0.84 mg/dL (ref 0.44–1.00)
GFR, Estimated: >60 mL/min (ref >60)
Glucose, Bld: 84 mg/dL (ref 70–99)
Potassium: 3.8 mmol/L (ref 3.5–5.1)
Sodium: 140 mmol/L (ref 135–145)

## 2023-07-23 LAB — CBC
HCT: 41.8 % (ref 36.0–46.0)
Hemoglobin: 13.7 g/dL (ref 12.0–15.0)
MCH: 27.4 pg (ref 26.0–34.0)
MCHC: 32.8 g/dL (ref 30.0–36.0)
MCV: 83.6 fL (ref 80.0–100.0)
Platelets: 238 10*3/uL (ref 150–400)
RBC: 5 MIL/uL (ref 3.87–5.11)
RDW: 14.3 % (ref 11.5–15.5)
WBC: 6.1 10*3/uL (ref 4.0–10.5)
nRBC: 0 % (ref 0.0–0.2)

## 2023-07-23 NOTE — H&P (View-Only) (Signed)
Primary Care Physician: Cyndia Skeeters, DO Referring Physician: Dr. Jodelle Red    Jane Sparks is a 57 y.o. female with a h/o new onset afib that pt was notified by her apple watch that she was in afib. She was treated at [facility] 6/16 thru 6/17. She  was started on daily Cardizem and eliquis for a CHA2DS2VASc of 1. She was d/c in afib but  presents today in SR. She  is very anxious about being here.Discussed triggers for afib. She denies snoring, 2 cups of coffee a day and drinks at least 2 drinks of alcohol a night. When mentioned alcohol can be a trigger, she said that she was not cutting back on her alcohol. She states that she is being compliant with cardizem and eliqius. She has had covid vaccines.  F/u in afib clinic, 02/07/22. I have not seen for 2 years. Afib has been quiet.  She has had  rhythm issues, had  prednisone use for rash on neck and face off several days before having afib and then started clindamycin for a wisdom tooth issue causing pain and requested an appointment. EKG shows typical atrial flutter at 86 bpm. She remains on eliquis 5 mg bid for a CHA2DS2VASc  score of 4. She has had some SR intermittently since being out of rhythm as reviewed on apple watch.  F/u in afib clinic, 5/11, she is back in SR with the extra Cardizem but had to go back to 120 mg daily as it made her head feel very full.   F/u in Afib clinic, 07/23/23. She is currently in rate controlled atrial flutter. S/p Afib and flutter ablation on 06/25/23 by Dr. Lalla Brothers. She states has been out of rhythm for > 2 weeks. She feels SOB when out of rhythm. No chest pain or trouble swallowing. Leg sites healed without issue. No missed doses of Eliquis.   Today, she denies symptoms of orthopnea, PND, lower extremity edema, dizziness, presyncope, syncope, snoring, daytime somnolence, bleeding, or neurologic sequela. The patient is tolerating medications without difficulties and is otherwise without complaint today.     Past Medical History:  Diagnosis Date   Alcohol use    Anxiety    Atrial fibrillation with RVR (HCC)    Smoker    Stroke (HCC) 06/2020   TIA   Past Surgical History:  Procedure Laterality Date   ATRIAL FIBRILLATION ABLATION N/A 06/25/2023   Procedure: ATRIAL FIBRILLATION ABLATION;  Surgeon: Lanier Prude, MD;  Location: [facility] INVASIVE CV LAB;  Service: Cardiovascular;  Laterality: N/A;   CARDIOVERSION N/A 03/07/2023   Procedure: CARDIOVERSION;  Surgeon: Jodelle Red, MD;  Location: [facility] INVASIVE CV LAB;  Service: Cardiovascular;  Laterality: N/A;    Current Outpatient Medications  Medication Sig Dispense Refill   acetaminophen (TYLENOL) 500 MG tablet Take 500-1,000 mg by mouth as needed (pain.).     albuterol (VENTOLIN HFA) 108 (90 Base) MCG/ACT inhaler Inhale 1-2 puffs into the lungs every 6 (six) hours as needed for wheezing or shortness of breath. 8 g 2   apixaban (ELIQUIS) 5 MG TABS tablet Take 1 tablet (5 mg total) by mouth 2 (two) times daily. 180 tablet 2   atorvastatin (LIPITOR) 40 MG tablet TAKE 1 TABLET BY MOUTH EVERY DAY 90 tablet 3   calcium carbonate (TUMS EX) 750 MG chewable tablet Chew 1 tablet by mouth daily as needed for heartburn.     cetirizine (ZYRTEC) 10 MG tablet Take 10 mg by mouth every evening.  Cholecalciferol (VITAMIN D3) 125 MCG (5000 UT) CAPS Take 5,000 Units by mouth in the morning.     diltiazem (CARDIZEM CD) 120 MG 24 hr capsule TAKE 1 CAPSULE BY MOUTH EVERY DAY 90 capsule 3   diphenhydrAMINE-zinc acetate (BENADRYL) cream Apply 1 Application topically 3 (three) times daily as needed for itching.     famotidine (PEPCID) 20 MG tablet Take 20 mg by mouth as needed for heartburn or indigestion.     ketotifen (ZADITOR) 0.035 % ophthalmic solution Place 1 drop into both eyes 2 (two) times daily as needed (allergies.).     loteprednol (ALREX) 0.2 % SUSP Place 1 drop into both eyes 2 (two) times daily as needed (allergies).     metoprolol  succinate (TOPROL-XL) 25 MG 24 hr tablet TAKE 1 TABLET (25 MG TOTAL) BY MOUTH DAILY. 90 tablet 2   OVER THE COUNTER MEDICATION Take 1 capsule by mouth daily as needed (anxiety). 10 mg CBD gummies     pantoprazole (PROTONIX) 40 MG tablet Take 1 tablet (40 mg total) by mouth daily. 45 tablet 0   No current facility-administered medications for this encounter.    Allergies  Allergen Reactions   Dexamethasone Other (See Comments)    Patient suspects contributing factor to Atrial fibrillation    Doxycycline Other (See Comments)    Patient suspects contributing factor to Atrial fibrillation    Penicillins Rash    Full body rash   Sulfa Antibiotics Rash    Full body rash    ROS- All systems are reviewed and negative except as per the HPI above  Physical Exam: Vitals:   07/23/23 1027  BP: (!) 140/92  Pulse: 83  Weight: 90.7 kg  Height: 5\' 4"  (1.626 m)    Wt Readings from Last 3 Encounters:  07/23/23 90.7 kg  06/25/23 88.9 kg  04/17/23 90.9 kg    Labs: Lab Results  Component Value Date   NA 140 06/12/2023   K 4.1 06/12/2023   CL 105 06/12/2023   CO2 21 06/12/2023   GLUCOSE 83 06/12/2023   BUN 17 06/12/2023   CREATININE 0.87 06/12/2023   CALCIUM 9.6 06/12/2023   MG 2.1 02/25/2023   Lab Results  Component Value Date   INR 1.5 (H) 08/17/2020   Lab Results  Component Value Date   CHOL 148 08/17/2020   HDL 34 (L) 08/17/2020   LDLCALC 100 (H) 08/17/2020   TRIG 68 08/17/2020   GEN- The patient is well appearing, alert and oriented x 3 today.   Neck - no JVD or carotid bruit noted Lungs- Clear to ausculation bilaterally, normal work of breathing Heart- Regular rate (atrial flutter), no murmurs, rubs or gallops, PMI not laterally displaced Extremities- no clubbing, cyanosis, or edema Skin - no rash or ecchymosis noted   EKG-  Vent. rate 83 BPM PR interval * ms QRS duration 78 ms QT/QTcB 338/397 ms P-R-T axes 87 73 63 Atrial flutter with 4:1 A-V  conduction Abnormal ECG When compared with ECG of 25-Jun-2023 11:55, PREVIOUS ECG IS PRESENT   1. Inferior basal hypokinesis . Left ventricular ejection fraction, by  estimation, is 55%. The left ventricle has normal function. The left  ventricle demonstrates regional wall motion abnormalities (see scoring  diagram/findings for description). Left  ventricular diastolic parameters are indeterminate.   2. Right ventricular systolic function is normal. The right ventricular  size is normal. There is normal pulmonary artery systolic pressure.   3. Left atrial size was moderately dilated.  4. The mitral valve is normal in structure. Mild mitral valve  regurgitation. No evidence of mitral stenosis.   5. The aortic valve is tricuspid. Aortic valve regurgitation is trivial.  Mild aortic valve sclerosis is present, with no evidence of aortic valve  stenosis.   6. The inferior vena cava is normal in size with greater than 50%  respiratory variability, suggesting right atrial pressure of 3 mmHg.    Assessment and Plan: 1. Paroxysmal atrial fib/flutter S/p Afib ablation on 06/25/23 by Dr. Lalla Brothers.  She is currently in atrial flutter. Discussion of options for treatment and recommendation for cardioversion for persistent flutter after ablation. After discussion, patient is in agreement to proceed with cardioversion. Labs obtained today.   Informed Consent   Shared Decision Making/Informed Consent The risks (stroke, cardiac arrhythmias rarely resulting in the need for a temporary or permanent pacemaker, skin irritation or burns and complications associated with conscious sedation including aspiration, arrhythmia, respiratory failure and death), benefits (restoration of normal sinus rhythm) and alternatives of a direct current cardioversion were explained in detail to Jane Sparks and she agrees to proceed.      Continue diltiazem 120 mg daily.  Continue Toprol 25 mg daily.   2. CHA2DS2VASc  score of 4 Continue eliquis 5 mg bid   F/u 2 weeks after DCCV.   Justin Mend, PA-C [facility] [address] [place], [place] [address] [phone]

## 2023-07-23 NOTE — Progress Notes (Signed)
Primary Care Physician: Cyndia Skeeters, DO Referring Physician: Dr. Jodelle Red    Jane Sparks is a 57 y.o. female with a h/o new onset afib that pt was notified by her apple watch that she was in afib. She was treated at [facility] 6/16 thru 6/17. She  was started on daily Cardizem and eliquis for a CHA2DS2VASc of 1. She was d/c in afib but  presents today in SR. She  is very anxious about being here.Discussed triggers for afib. She denies snoring, 2 cups of coffee a day and drinks at least 2 drinks of alcohol a night. When mentioned alcohol can be a trigger, she said that she was not cutting back on her alcohol. She states that she is being compliant with cardizem and eliqius. She has had covid vaccines.  F/u in afib clinic, 02/07/22. I have not seen for 2 years. Afib has been quiet.  She has had  rhythm issues, had  prednisone use for rash on neck and face off several days before having afib and then started clindamycin for a wisdom tooth issue causing pain and requested an appointment. EKG shows typical atrial flutter at 86 bpm. She remains on eliquis 5 mg bid for a CHA2DS2VASc  score of 4. She has had some SR intermittently since being out of rhythm as reviewed on apple watch.  F/u in afib clinic, 5/11, she is back in SR with the extra Cardizem but had to go back to 120 mg daily as it made her head feel very full.   F/u in Afib clinic, 07/23/23. She is currently in rate controlled atrial flutter. S/p Afib and flutter ablation on 06/25/23 by Dr. Lalla Brothers. She states has been out of rhythm for > 2 weeks. She feels SOB when out of rhythm. No chest pain or trouble swallowing. Leg sites healed without issue. No missed doses of Eliquis.   Today, she denies symptoms of orthopnea, PND, lower extremity edema, dizziness, presyncope, syncope, snoring, daytime somnolence, bleeding, or neurologic sequela. The patient is tolerating medications without difficulties and is otherwise without complaint today.     Past Medical History:  Diagnosis Date   Alcohol use    Anxiety    Atrial fibrillation with RVR (HCC)    Smoker    Stroke (HCC) 06/2020   TIA   Past Surgical History:  Procedure Laterality Date   ATRIAL FIBRILLATION ABLATION N/A 06/25/2023   Procedure: ATRIAL FIBRILLATION ABLATION;  Surgeon: Lanier Prude, MD;  Location: [facility] INVASIVE CV LAB;  Service: Cardiovascular;  Laterality: N/A;   CARDIOVERSION N/A 03/07/2023   Procedure: CARDIOVERSION;  Surgeon: Jodelle Red, MD;  Location: [facility] INVASIVE CV LAB;  Service: Cardiovascular;  Laterality: N/A;    Current Outpatient Medications  Medication Sig Dispense Refill   acetaminophen (TYLENOL) 500 MG tablet Take 500-1,000 mg by mouth as needed (pain.).     albuterol (VENTOLIN HFA) 108 (90 Base) MCG/ACT inhaler Inhale 1-2 puffs into the lungs every 6 (six) hours as needed for wheezing or shortness of breath. 8 g 2   apixaban (ELIQUIS) 5 MG TABS tablet Take 1 tablet (5 mg total) by mouth 2 (two) times daily. 180 tablet 2   atorvastatin (LIPITOR) 40 MG tablet TAKE 1 TABLET BY MOUTH EVERY DAY 90 tablet 3   calcium carbonate (TUMS EX) 750 MG chewable tablet Chew 1 tablet by mouth daily as needed for heartburn.     cetirizine (ZYRTEC) 10 MG tablet Take 10 mg by mouth every evening.  Cholecalciferol (VITAMIN D3) 125 MCG (5000 UT) CAPS Take 5,000 Units by mouth in the morning.     diltiazem (CARDIZEM CD) 120 MG 24 hr capsule TAKE 1 CAPSULE BY MOUTH EVERY DAY 90 capsule 3   diphenhydrAMINE-zinc acetate (BENADRYL) cream Apply 1 Application topically 3 (three) times daily as needed for itching.     famotidine (PEPCID) 20 MG tablet Take 20 mg by mouth as needed for heartburn or indigestion.     ketotifen (ZADITOR) 0.035 % ophthalmic solution Place 1 drop into both eyes 2 (two) times daily as needed (allergies.).     loteprednol (ALREX) 0.2 % SUSP Place 1 drop into both eyes 2 (two) times daily as needed (allergies).     metoprolol  succinate (TOPROL-XL) 25 MG 24 hr tablet TAKE 1 TABLET (25 MG TOTAL) BY MOUTH DAILY. 90 tablet 2   OVER THE COUNTER MEDICATION Take 1 capsule by mouth daily as needed (anxiety). 10 mg CBD gummies     pantoprazole (PROTONIX) 40 MG tablet Take 1 tablet (40 mg total) by mouth daily. 45 tablet 0   No current facility-administered medications for this encounter.    Allergies  Allergen Reactions   Dexamethasone Other (See Comments)    Patient suspects contributing factor to Atrial fibrillation    Doxycycline Other (See Comments)    Patient suspects contributing factor to Atrial fibrillation    Penicillins Rash    Full body rash   Sulfa Antibiotics Rash    Full body rash    ROS- All systems are reviewed and negative except as per the HPI above  Physical Exam: Vitals:   07/23/23 1027  BP: (!) 140/92  Pulse: 83  Weight: 90.7 kg  Height: 5\' 4"  (1.626 m)    Wt Readings from Last 3 Encounters:  07/23/23 90.7 kg  06/25/23 88.9 kg  04/17/23 90.9 kg    Labs: Lab Results  Component Value Date   NA 140 06/12/2023   K 4.1 06/12/2023   CL 105 06/12/2023   CO2 21 06/12/2023   GLUCOSE 83 06/12/2023   BUN 17 06/12/2023   CREATININE 0.87 06/12/2023   CALCIUM 9.6 06/12/2023   MG 2.1 02/25/2023   Lab Results  Component Value Date   INR 1.5 (H) 08/17/2020   Lab Results  Component Value Date   CHOL 148 08/17/2020   HDL 34 (L) 08/17/2020   LDLCALC 100 (H) 08/17/2020   TRIG 68 08/17/2020   GEN- The patient is well appearing, alert and oriented x 3 today.   Neck - no JVD or carotid bruit noted Lungs- Clear to ausculation bilaterally, normal work of breathing Heart- Regular rate (atrial flutter), no murmurs, rubs or gallops, PMI not laterally displaced Extremities- no clubbing, cyanosis, or edema Skin - no rash or ecchymosis noted   EKG-  Vent. rate 83 BPM PR interval * ms QRS duration 78 ms QT/QTcB 338/397 ms P-R-T axes 87 73 63 Atrial flutter with 4:1 A-V  conduction Abnormal ECG When compared with ECG of 25-Jun-2023 11:55, PREVIOUS ECG IS PRESENT   1. Inferior basal hypokinesis . Left ventricular ejection fraction, by  estimation, is 55%. The left ventricle has normal function. The left  ventricle demonstrates regional wall motion abnormalities (see scoring  diagram/findings for description). Left  ventricular diastolic parameters are indeterminate.   2. Right ventricular systolic function is normal. The right ventricular  size is normal. There is normal pulmonary artery systolic pressure.   3. Left atrial size was moderately dilated.  4. The mitral valve is normal in structure. Mild mitral valve  regurgitation. No evidence of mitral stenosis.   5. The aortic valve is tricuspid. Aortic valve regurgitation is trivial.  Mild aortic valve sclerosis is present, with no evidence of aortic valve  stenosis.   6. The inferior vena cava is normal in size with greater than 50%  respiratory variability, suggesting right atrial pressure of 3 mmHg.    Assessment and Plan: 1. Paroxysmal atrial fib/flutter S/p Afib ablation on 06/25/23 by Dr. Lalla Brothers.  She is currently in atrial flutter. Discussion of options for treatment and recommendation for cardioversion for persistent flutter after ablation. After discussion, patient is in agreement to proceed with cardioversion. Labs obtained today.   Informed Consent   Shared Decision Making/Informed Consent The risks (stroke, cardiac arrhythmias rarely resulting in the need for a temporary or permanent pacemaker, skin irritation or burns and complications associated with conscious sedation including aspiration, arrhythmia, respiratory failure and death), benefits (restoration of normal sinus rhythm) and alternatives of a direct current cardioversion were explained in detail to Ms. Alla German and she agrees to proceed.      Continue diltiazem 120 mg daily.  Continue Toprol 25 mg daily.   2. CHA2DS2VASc  score of 4 Continue eliquis 5 mg bid   F/u 2 weeks after DCCV.   Justin Mend, PA-C [facility] [address] [place], [place] [address] [phone]

## 2023-07-23 NOTE — Patient Instructions (Addendum)
Cardioversion scheduled ZOX:WRUEAV, October 18th   - Arrive at the [facility] and go to admitting at 1130am   - Do not eat or drink anything after midnight the night prior to your procedure.   - Take all your morning medication (except diabetic medications) with a sip of water prior to arrival.  - You will not be able to drive home after your procedure.    - Do NOT miss any doses of your blood thinner - if you should miss a dose please notify our office immediately.   - If you feel as if you go back into normal rhythm prior to scheduled cardioversion, please notify our office immediately.   If your procedure is canceled in the cardioversion suite you will be charged a cancellation fee.

## 2023-07-24 NOTE — Progress Notes (Signed)
Spoke to pt and instructed them to come at 1000 and to be NPO after 0000. Confirmed no missed doses of AC and instructed to take in AM with a small sip of water.   Confirmed that pt will have a ride home and someone to stay with them for 24 hours after the procedure.

## 2023-07-25 ENCOUNTER — Ambulatory Visit (HOSPITAL_COMMUNITY): Payer: Managed Care, Other (non HMO)

## 2023-07-25 ENCOUNTER — Other Ambulatory Visit: Payer: Self-pay

## 2023-07-25 ENCOUNTER — Ambulatory Visit (HOSPITAL_COMMUNITY)
Admission: RE | Admit: 2023-07-25 | Discharge: 2023-07-25 | Disposition: A | Discharge status: Home / Self Care | Payer: Managed Care, Other (non HMO) | Attending: Cardiovascular Disease | Admitting: Cardiovascular Disease

## 2023-07-25 ENCOUNTER — Encounter (HOSPITAL_COMMUNITY)
Admission: RE | Disposition: A | Discharge status: Home / Self Care | Payer: Self-pay | Source: Home / Self Care | Attending: Cardiovascular Disease

## 2023-07-25 ENCOUNTER — Encounter (HOSPITAL_COMMUNITY): Payer: Self-pay | Admitting: Cardiovascular Disease

## 2023-07-25 DIAGNOSIS — Z79899 Other long term (current) drug therapy: Secondary | ICD-10-CM | POA: Diagnosis not present

## 2023-07-25 DIAGNOSIS — Z7901 Long term (current) use of anticoagulants: Secondary | ICD-10-CM | POA: Diagnosis not present

## 2023-07-25 DIAGNOSIS — I480 Paroxysmal atrial fibrillation: Secondary | ICD-10-CM | POA: Insufficient documentation

## 2023-07-25 DIAGNOSIS — I4891 Unspecified atrial fibrillation: Secondary | ICD-10-CM | POA: Diagnosis not present

## 2023-07-25 DIAGNOSIS — I4892 Unspecified atrial flutter: Secondary | ICD-10-CM | POA: Insufficient documentation

## 2023-07-25 DIAGNOSIS — I483 Typical atrial flutter: Secondary | ICD-10-CM

## 2023-07-25 HISTORY — PX: CARDIOVERSION: SHX1299

## 2023-07-25 SURGERY — CARDIOVERSION
Anesthesia: General

## 2023-07-25 MED ORDER — PROPOFOL 10 MG/ML IV BOLUS
INTRAVENOUS | Status: DC | PRN
  Administered 2023-07-25: 80 mg via INTRAVENOUS

## 2023-07-25 MED ORDER — LIDOCAINE 2% (20 MG/ML) 5 ML SYRINGE
INTRAMUSCULAR | Status: DC | PRN
  Administered 2023-07-25: 60 mg via INTRAVENOUS

## 2023-07-25 SURGICAL SUPPLY — 1 items: PAD DEFIB RADIO PHYSIO CONN (PAD) ×1 IMPLANT

## 2023-07-25 NOTE — Discharge Instructions (Signed)
Electrical Cardioversion °Electrical cardioversion is the delivery of a jolt of electricity to restore a normal rhythm to the heart. A rhythm that is too fast or is not regular keeps the heart from pumping well. In this procedure, sticky patches or metal paddles are placed on the chest to deliver electricity to the heart from a device. °This procedure may be done in an emergency if: °There is low or no blood pressure as a result of the heart rhythm. °Normal rhythm must be restored as fast as possible to protect the brain and heart from further damage. °It may save a life. °This may also be a scheduled procedure for irregular or fast heart rhythms that are not immediately life-threatening. ° °What can I expect after the procedure? °Your blood pressure, heart rate, breathing rate, and blood oxygen level will be monitored until you leave the hospital or clinic. °Your heart rhythm will be watched to make sure it does not change. °You may have some redness on the skin where the shocks were given. Over the counter cortizone cream may be helpful.  °Follow these instructions at home: °Do not drive for 24 hours if you were given a sedative during your procedure. °Take over-the-counter and prescription medicines only as told by your health care provider. °Ask your health care provider how to check your pulse. Check it often. °Rest for 48 hours after the procedure or as told by your health care provider. °Avoid or limit your caffeine use as told by your health care provider. °Keep all follow-up visits as told by your health care provider. This is important. °Contact a health care provider if: °You feel like your heart is beating too quickly or your pulse is not regular. °You have a serious muscle cramp that does not go away. °Get help right away if: °You have discomfort in your chest. °You are dizzy or you feel faint. °You have trouble breathing or you are short of breath. °Your speech is slurred. °You have trouble moving an  arm or leg on one side of your body. °Your fingers or toes turn cold or blue. °Summary °Electrical cardioversion is the delivery of a jolt of electricity to restore a normal rhythm to the heart. °This procedure may be done right away in an emergency or may be a scheduled procedure if the condition is not an emergency. °Generally, this is a safe procedure. °After the procedure, check your pulse often as told by your health care provider. °This information is not intended to replace advice given to you by your health care provider. Make sure you discuss any questions you have with your health care provider. °Document Revised: 04/26/2019 Document Reviewed: 04/26/2019 °Elsevier Patient Education © 2020 [employer] Inc.  °

## 2023-07-25 NOTE — Interval H&P Note (Signed)
History and Physical Interval Note:  07/25/2023 10:01 AM  Jane Sparks  has presented today for surgery, with the diagnosis of AFIB.  The various methods of treatment have been discussed with the patient and family. After consideration of risks, benefits and other options for treatment, the patient has consented to  Procedure(s): CARDIOVERSION (N/A) as a surgical intervention.  The patient's history has been reviewed, patient examined, no change in status, stable for surgery.  I have reviewed the patient's chart and labs.  Questions were answered to the patient's satisfaction.    NPO for DCCV. On eliquis >3 weeks.   [name], MD, [facility]  [facility]  [address] [place], [place] [address] [phone]  10:01 AM

## 2023-07-25 NOTE — Anesthesia Preprocedure Evaluation (Signed)
Anesthesia Evaluation  Patient identified by MRN, date of birth, ID band Patient awake    Reviewed: Allergy & Precautions, H&P , NPO status , Patient's Chart, lab work & pertinent test results  Airway Mallampati: II  TM Distance: >3 FB Neck ROM: Full    Dental no notable dental hx. (+) Dental Advisory Given   Pulmonary neg pulmonary ROS, former smoker   Pulmonary exam normal breath sounds clear to auscultation       Cardiovascular hypertension, Normal cardiovascular exam+ dysrhythmias Atrial Fibrillation  Rhythm:Regular Rate:Normal     Neuro/Psych  PSYCHIATRIC DISORDERS Anxiety Depression    CVA, No Residual Symptoms    GI/Hepatic negative GI ROS, Neg liver ROS,,,  Endo/Other  negative endocrine ROS    Renal/GU negative Renal ROS  negative genitourinary   Musculoskeletal negative musculoskeletal ROS (+)    Abdominal   Peds negative pediatric ROS (+)  Hematology negative hematology ROS (+)   Anesthesia Other Findings   Reproductive/Obstetrics negative OB ROS                             Anesthesia Physical Anesthesia Plan  ASA: 3  Anesthesia Plan: General   Post-op Pain Management:    Induction: Intravenous  PONV Risk Score and Plan:   Airway Management Planned: Natural Airway  Additional Equipment:   Intra-op Plan:   Post-operative Plan:   Informed Consent: I have reviewed the patients History and Physical, chart, labs and discussed the procedure including the risks, benefits and alternatives for the proposed anesthesia with the patient or authorized representative who has indicated his/her understanding and acceptance.     Dental advisory given  Plan Discussed with: CRNA  Anesthesia Plan Comments:        Anesthesia Quick Evaluation

## 2023-07-25 NOTE — CV Procedure (Signed)
DIRECT CURRENT CARDIOVERSION  NAME:  Jane Sparks    MRN: [id number] DOB:  1966/04/06    ADMIT DATE: 07/25/2023  Indication:  Symptomatic atrial flutter  Procedure Note:  The patient signed informed consent.  They have had had therapeutic anticoagulation with eliquis greater than 3 weeks.  Anesthesia was administered by Dr. Charlynn Grimes.  Adequate airway was maintained throughout and vital followed per protocol.  They were cardioverted x 1 with 200J of biphasic synchronized energy.  They converted to NSR.  There were no apparent complications.  The patient had normal neuro status and respiratory status post procedure with vitals stable as recorded elsewhere.    Follow up: They will continue on current medical therapy and follow up with cardiology as scheduled.  [name], MD, [facility]  [facility]  [address], [address] [place], [place] [address] [phone]  10:31 AM

## 2023-07-25 NOTE — Transfer of Care (Signed)
Immediate Anesthesia Transfer of Care Note  Patient: Jane Sparks  Procedure(s) Performed: CARDIOVERSION  Patient Location: Cath Lab  Anesthesia Type:General  Level of Consciousness: drowsy and patient cooperative  Airway & Oxygen Therapy: Patient Spontanous Breathing and Patient connected to nasal cannula oxygen  Post-op Assessment: Report given to RN, Post -op Vital signs reviewed and stable, and Patient moving all extremities X 4  Post vital signs: Reviewed and stable  Last Vitals:  Vitals Value Taken Time  BP 129/86 07/25/23 1024  Temp    Pulse 74 07/25/23 1027  Resp 22 07/25/23 1027  SpO2 90 % 07/25/23 1027    Last Pain:  Vitals:   07/25/23 0959  TempSrc:   PainSc: 0-No pain         Complications: No notable events documented.

## 2023-07-25 NOTE — Anesthesia Postprocedure Evaluation (Signed)
Anesthesia Post Note  Patient: Jane Sparks  Procedure(s) Performed: CARDIOVERSION     Patient location during evaluation: PACU Anesthesia Type: General Level of consciousness: awake and alert Pain management: pain level controlled Vital Signs Assessment: post-procedure vital signs reviewed and stable Respiratory status: spontaneous breathing, nonlabored ventilation, respiratory function stable and patient connected to nasal cannula oxygen Cardiovascular status: blood pressure returned to baseline and stable Postop Assessment: no apparent nausea or vomiting Anesthetic complications: no   No notable events documented.  Last Vitals:  Vitals:   07/25/23 1040 07/25/23 1050  BP: 102/65 101/67  Pulse: (!) 59 (!) 54  Resp: 19 (!) 7  Temp:    SpO2: 96% 97%    Last Pain:  Vitals:   07/25/23 1031  TempSrc: Temporal  PainSc: 0-No pain                 [place]

## 2023-07-28 ENCOUNTER — Encounter (HOSPITAL_COMMUNITY): Payer: Self-pay | Admitting: Cardiovascular Disease

## 2023-07-28 NOTE — Plan of Care (Signed)
[facility] Tonsillectomy/Adenoidectomy, Postoperative PEDS care plan entered in error.

## 2023-08-05 ENCOUNTER — Ambulatory Visit (HOSPITAL_COMMUNITY)
Admission: RE | Admit: 2023-08-05 | Discharge: 2023-08-05 | Disposition: A | Discharge status: Home / Self Care | Payer: Managed Care, Other (non HMO) | Source: Ambulatory Visit | Attending: Physician Assistant | Admitting: Physician Assistant

## 2023-08-05 ENCOUNTER — Encounter (HOSPITAL_COMMUNITY): Payer: Self-pay | Admitting: Physician Assistant

## 2023-08-05 VITALS — BP 138/88 | HR 63 | Ht 64.0 in | Wt 200.0 lb

## 2023-08-05 DIAGNOSIS — I4819 Other persistent atrial fibrillation: Secondary | ICD-10-CM | POA: Diagnosis not present

## 2023-08-05 DIAGNOSIS — I4891 Unspecified atrial fibrillation: Secondary | ICD-10-CM | POA: Diagnosis present

## 2023-08-05 DIAGNOSIS — I4892 Unspecified atrial flutter: Secondary | ICD-10-CM | POA: Insufficient documentation

## 2023-08-05 DIAGNOSIS — Z7901 Long term (current) use of anticoagulants: Secondary | ICD-10-CM | POA: Diagnosis not present

## 2023-08-05 DIAGNOSIS — R9431 Abnormal electrocardiogram [ECG] [EKG]: Secondary | ICD-10-CM | POA: Diagnosis not present

## 2023-08-05 DIAGNOSIS — D6869 Other thrombophilia: Secondary | ICD-10-CM | POA: Insufficient documentation

## 2023-08-05 NOTE — Progress Notes (Signed)
Primary Care Physician: Cyndia Skeeters, DO Referring Physician: Dr. Jodelle Red    Jane Sparks is a 57 y.o. female with a h/o new onset afib that pt was notified by her apple watch that she was in afib. She was treated at [facility] 6/16 thru 6/17. She  was started on daily Cardizem and eliquis for a CHA2DS2VASc of 1. She was d/c in afib but  presents today in SR. She  is very anxious about being here.Discussed triggers for afib. She denies snoring, 2 cups of coffee a day and drinks at least 2 drinks of alcohol a night. When mentioned alcohol can be a trigger, she said that she was not cutting back on her alcohol. She states that she is being compliant with cardizem and eliqius. She has had covid vaccines.  F/u in afib clinic, 02/07/22. I have not seen for 2 years. Afib has been quiet.  She has had  rhythm issues, had  prednisone use for rash on neck and face off several days before having afib and then started clindamycin for a wisdom tooth issue causing pain and requested an appointment. EKG shows typical atrial flutter at 86 bpm. She remains on eliquis 5 mg bid for a CHA2DS2VASc  score of 4. She has had some SR intermittently since being out of rhythm as reviewed on apple watch.  F/u in afib clinic, 5/11, she is back in SR with the extra Cardizem but had to go back to 120 mg daily as it made her head feel very full.   F/u in Afib clinic, 07/23/23. She is currently in rate controlled atrial flutter. S/p Afib and flutter ablation on 06/25/23 by Dr. Lalla Brothers. She states has been out of rhythm for > 2 weeks. She feels SOB when out of rhythm. No chest pain or trouble swallowing. Leg sites healed without issue. No missed doses of Eliquis.   Follow up in the AF clinic 08/05/23. Patient is s/p DCCV on 07/25/23. She does feel improved in SR but does still have fatigue with exertion. She admits she has not been very active since her afib diagnosis. No bleeding issues on anticoagulation.   Today, she  denies symptoms of palpitations, chest pain, orthopnea, PND, lower extremity edema, dizziness, presyncope, syncope, snoring, daytime somnolence, bleeding, or neurologic sequela. The patient is tolerating medications without difficulties and is otherwise without complaint today.    Past Medical History:  Diagnosis Date   Alcohol use    Anxiety    Atrial fibrillation with RVR (HCC)    Smoker    Stroke (HCC) 06/2020   TIA     Current Outpatient Medications  Medication Sig Dispense Refill   acetaminophen (TYLENOL) 500 MG tablet Take 500-1,000 mg by mouth as needed (pain.).     albuterol (VENTOLIN HFA) 108 (90 Base) MCG/ACT inhaler Inhale 1-2 puffs into the lungs every 6 (six) hours as needed for wheezing or shortness of breath. 8 g 2   apixaban (ELIQUIS) 5 MG TABS tablet Take 1 tablet (5 mg total) by mouth 2 (two) times daily. 180 tablet 2   atorvastatin (LIPITOR) 40 MG tablet TAKE 1 TABLET BY MOUTH EVERY DAY 90 tablet 3   calcium carbonate (TUMS EX) 750 MG chewable tablet Chew 1 tablet by mouth daily as needed for heartburn.     cetirizine (ZYRTEC) 10 MG tablet Take 10 mg by mouth every evening.     Cholecalciferol (VITAMIN D3) 125 MCG (5000 UT) CAPS Take 5,000 Units by mouth in  the morning.     diltiazem (CARDIZEM CD) 120 MG 24 hr capsule TAKE 1 CAPSULE BY MOUTH EVERY DAY 90 capsule 3   diphenhydrAMINE-zinc acetate (BENADRYL) cream Apply 1 Application topically 3 (three) times daily as needed (bug bited).     famotidine (PEPCID) 20 MG tablet Take 20 mg by mouth as needed for heartburn or indigestion.     ketotifen (ZADITOR) 0.035 % ophthalmic solution Place 1 drop into both eyes 2 (two) times daily as needed (allergies.).     loteprednol (ALREX) 0.2 % SUSP Place 1 drop into both eyes 2 (two) times daily as needed (allergies).     metoprolol succinate (TOPROL-XL) 25 MG 24 hr tablet TAKE 1 TABLET (25 MG TOTAL) BY MOUTH DAILY. 90 tablet 2   OVER THE COUNTER MEDICATION Take 10 mg by mouth  daily as needed (anxiety).  CBD gummies     pantoprazole (PROTONIX) 40 MG tablet Take 1 tablet (40 mg total) by mouth daily. 45 tablet 0   No current facility-administered medications for this encounter.     ROS- All systems are reviewed and negative except as per the HPI above  Physical Exam: Vitals:   08/05/23 1032  BP: 138/88  Pulse: 63  Weight: 90.7 kg  Height: 5\' 4"  (1.626 m)     Wt Readings from Last 3 Encounters:  08/05/23 90.7 kg  07/25/23 90.7 kg  07/23/23 90.7 kg    GEN: Well nourished, well developed in no acute distress NECK: No JVD; No carotid bruits CARDIAC: Regular rate and rhythm, no murmurs, rubs, gallops RESPIRATORY:  Clear to auscultation without rales, wheezing or rhonchi  ABDOMEN: Soft, non-tender, non-distended EXTREMITIES:  No edema; No deformity     EKG today demonstrates SR, inc RBBB Vent. rate 63 BPM PR interval 192 ms QRS duration 106 ms QT/QTcB 434/444 ms  Echo 03/23/20  1. Inferior basal hypokinesis . Left ventricular ejection fraction, by  estimation, is 55%. The left ventricle has normal function. The left  ventricle demonstrates regional wall motion abnormalities (see scoring  diagram/findings for description). Left ventricular diastolic parameters are indeterminate.   2. Right ventricular systolic function is normal. The right ventricular  size is normal. There is normal pulmonary artery systolic pressure.   3. Left atrial size was moderately dilated.   4. The mitral valve is normal in structure. Mild mitral valve  regurgitation. No evidence of mitral stenosis.   5. The aortic valve is tricuspid. Aortic valve regurgitation is trivial.  Mild aortic valve sclerosis is present, with no evidence of aortic valve  stenosis.   6. The inferior vena cava is normal in size with greater than 50%  respiratory variability, suggesting right atrial pressure of 3 mmHg.    CHA2DS2-VASc Score = 3  The patient's score is based upon: CHF  History: 0 HTN History: 0 Diabetes History: 0 Stroke History: 2 Vascular Disease History: 0 Age Score: 0 Gender Score: 1       ASSESSMENT AND PLAN: Persistent Atrial Fibrillation/atrial flutter The patient's CHA2DS2-VASc score is 3, indicating a 3.2% annual risk of stroke.   S/p Afib ablation on 06/25/23 by Dr. Lalla Brothers. S/p DCCV on 07/25/23 Patient appears to be maintaining SR. Hopefully, her fatigue will improve as she stays in SR. Could consider decreasing/discontinuing BB once she is out of the blanking period. Continue Toprol 25 mg daily Continue Eliquis 5 mg BID Continue diltiazem 120 mg daily  Secondary Hypercoagulable State (ICD10:  D68.69) The patient is at significant risk for  stroke/thromboembolism based upon her CHA2DS2-VASc Score of 3.  Continue Apixaban (Eliquis).     Follow up with Dr Cristal Deer and Francis Dowse as scheduled.    Jorja Loa PA-C Af[facility] [address] [place], [place] [address] [phone]

## 2023-08-18 ENCOUNTER — Ambulatory Visit: Payer: Managed Care, Other (non HMO) | Admitting: Physician Assistant

## 2023-09-03 ENCOUNTER — Other Ambulatory Visit (HOSPITAL_COMMUNITY): Payer: Managed Care, Other (non HMO)

## 2023-09-19 ENCOUNTER — Encounter (HOSPITAL_BASED_OUTPATIENT_CLINIC_OR_DEPARTMENT_OTHER): Payer: Self-pay | Admitting: Cardiology

## 2023-09-19 ENCOUNTER — Ambulatory Visit (HOSPITAL_BASED_OUTPATIENT_CLINIC_OR_DEPARTMENT_OTHER): Payer: Managed Care, Other (non HMO) | Admitting: Cardiology

## 2023-09-19 VITALS — BP 132/70 | HR 67 | Ht 64.0 in | Wt 201.0 lb

## 2023-09-19 DIAGNOSIS — I483 Typical atrial flutter: Secondary | ICD-10-CM

## 2023-09-19 DIAGNOSIS — Z8673 Personal history of transient ischemic attack (TIA), and cerebral infarction without residual deficits: Secondary | ICD-10-CM | POA: Diagnosis not present

## 2023-09-19 DIAGNOSIS — Z7901 Long term (current) use of anticoagulants: Secondary | ICD-10-CM

## 2023-09-19 DIAGNOSIS — I480 Paroxysmal atrial fibrillation: Secondary | ICD-10-CM

## 2023-09-19 DIAGNOSIS — D6869 Other thrombophilia: Secondary | ICD-10-CM

## 2023-09-19 DIAGNOSIS — R0683 Snoring: Secondary | ICD-10-CM

## 2023-09-19 DIAGNOSIS — Z7189 Other specified counseling: Secondary | ICD-10-CM

## 2023-09-19 DIAGNOSIS — R030 Elevated blood-pressure reading, without diagnosis of hypertension: Secondary | ICD-10-CM

## 2023-09-19 NOTE — Patient Instructions (Addendum)
We will wean the metoprolol. Cut the pill in half, take 12.5 mg daily for 4 days, then stop.  Have Lipid Panel today   Call in March to schedule June appointment

## 2023-09-19 NOTE — Progress Notes (Signed)
  Cardiology Office Note:  .   Date:  09/28/2023  ID:  CLARIBEL BOTA, DOB 07-Mar-1966, MRN [id number] PCP: Cyndia Skeeters, DO  [facility] Providers Cardiologist:  Jodelle Red, MD Electrophysiologist:  Lanier Prude, MD {  History of Present Illness: .   Jane Sparks is a 57 y.o. female with PMH CVA, atrial flutter, atrial fibrillation, white coat hypertension. I met her during her hospitalization 03/23/20.  Pertinent CV history: New onset atrial flutter 03/2020. Evidence of embolic stroke 08/2020 R MCA. S/P ablation 06/2023.  Today: Overall doing well. Has not felt any afib/flutter since her cardioversion. Has been able to tolerate more activity. Can walk up her hilly driveway and walk up her 5 stairs into her house now, which she couldn't do before. She does 2 flights of stairs at work, now only has to stop once whereas she used to have to stop every 5 stairs.  Has some fatigue, discussed stopping metoprolol today.   ROS: Denies chest pain, shortness of breath at rest. No PND, orthopnea, LE edema or unexpected weight gain. No syncope or palpitations. ROS otherwise negative except as noted.   Studies Reviewed: Marland Kitchen    EKG:       Physical Exam:   VS:  BP 132/70   Pulse 67   Ht 5\' 4"  (1.626 m)   Wt 201 lb (91.2 kg)   SpO2 98%   BMI 34.50 kg/m    Wt Readings from Last 3 Encounters:  09/24/23 201 lb 3.2 oz (91.3 kg)  09/19/23 201 lb (91.2 kg)  08/05/23 200 lb (90.7 kg)    GEN: Well nourished, well developed in no acute distress HEENT: Normal, moist mucous membranes NECK: No JVD CARDIAC: regular rhythm, normal S1 and S2, no rubs or gallops. No murmur. VASCULAR: Radial and DP pulses 2+ bilaterally. No carotid bruits RESPIRATORY:  Clear to auscultation without rales, wheezing or rhonchi  ABDOMEN: Soft, non-tender, non-distended MUSCULOSKELETAL:  Ambulates independently SKIN: Warm and dry, no edema NEUROLOGIC:  Alert and oriented x 3. No focal neuro deficits  noted. PSYCHIATRIC:  Normal affect    ASSESSMENT AND PLAN: .    Paroxysmal atrial flutter/atrial fibrillation History of CVA -CHA2DS2/VAS Stroke Risk Points=3  -s/p ablation by Dr. Lalla Brothers 06/2023, required cardioversion 07/2023 -continue apixaban -tolerating diltiazem, continue  -given fatigue, will try to wean beta blocker -discussed red flag warning signs, when to call the office and when to go to ER -on statin, tolerating. Check lipids today -quit smoking 03/2020 -only 1 cup coffee/day -rare alcohol, only socially -no history of sleep apnea, but does snore. Discussed sleep study today. STOP-BANG 4. -has apple watch, has been checking ECG   White coat syndrome -reports BP always higher in the office, normal when she has checked at home (though no recent home Bps) -follow  CV risk counseling and prevention -recommend heart healthy/Mediterranean diet, with whole grains, fruits, vegetable, fish, lean meats, nuts, and olive oil. Limit salt. -recommend moderate walking, 3-5 times/week for 30-50 minutes each session. Aim for at least 150 minutes.week. Goal should be pace of 3 miles/hours, or walking 1.5 miles in 30 minutes -recommend avoidance of tobacco products. Avoid excess alcohol.  Dispo: 6 mos or sooner as needed  Signed, Jodelle Red, MD   Jodelle Red, MD, PhD, [facility] [place]  [facility]  [place]  [facility] at [facility] at [facility] [address], [address] [place], [place] [address] [phone]

## 2023-09-20 LAB — LIPID PANEL
Chol/HDL Ratio: 3.2 {ratio} (ref 0.0–4.4)
Cholesterol, Total: 120 mg/dL (ref 100–199)
HDL: 38 mg/dL — ABNORMAL LOW (ref >39)
LDL Chol Calc (NIH): 67 mg/dL (ref 0–99)
Triglycerides: 72 mg/dL (ref 0–149)
VLDL Cholesterol Cal: 15 mg/dL (ref 5–40)

## 2023-09-21 NOTE — Progress Notes (Unsigned)
Cardiology Office Note:  .   Date:  09/21/2023  ID:  Jane Sparks, DOB Feb 25, 1966, MRN [id number] PCP: Cyndia Skeeters, DO  [facility] Providers Cardiologist:  Jodelle Red, MD Electrophysiologist:  Lanier Prude, MD {  History of Present Illness: .   Jane STROUSS is a 57 y.o. female w/PMHx of stroke, AFib/flutter, RBBB Hx of ETOH, tobacco  She saw Dr. Lalla Brothers 04/17/23, pt preferred to avoid further meds and planned for ablation for rhythm management  Ablation 06/25/23  AFib clinic 07/23/23, reported out of rhythm a couple weeks, more SOB in AFib, EKG reported as AFlutter and planned for DCCV  DCCV 07/25/23 > NSR  AFib clinic 08/05/23, remained in SR, feeling better, had not been as active as usual with suspect some degree of deconditioning.  Thought towards consideration of reducing BB dose for some fatigue.  Saw Dr. Cristal Deer 09/19/23 : is an incomplete note but looks like metoprolol weaned to off  Today's visit is scheduled as a 3 mo post ablation visit  ROS:   She is doing well Feels much better off the BB, more energy No symptoms of her Afib/flutter since her DCCV Last week at the grocery store had a funny "vertigo-like" episode did not feel like she was fainting, maybe a little SOB with it, was brief and not recurrent No near syncope or syncope No bleeding or signs of bleeding  No groin/site concerns, healed well No CP No SOB   Arrhythmia/AAD hx AFlutter noted Jan 2021 AFib June 2024 Ablation 06/25/23: PVI, posterior wall ablation of atypical atrial flutter utilizing the Right PV's and roof  CTI line  Studies Reviewed: Marland Kitchen    EKG done today and reviewed by myself:  SR 66bpm, RBBB   06/25/23: EPS/ablation CONCLUSIONS: 1. Successful PVI 2. Successful ablation/isolation of the posterior wall 3. Successful ablation of atypical atrial flutter utilizing the Right PV's and roof 4. Successful ablation of the cavotricuspid isthmus for  typical atrial flutter 5. Intracardiac echo reveals trivial pericardial effusion, normal LA architecture 6. No early apparent complications. 7. Colchicine 0.6mg  PO BID x 5 days 8. Protonix 40mg  PO daily x 45 days   03/23/2020: TTE  1. Inferior basal hypokinesis . Left ventricular ejection fraction, by  estimation, is 55%. The left ventricle has normal function. The left  ventricle demonstrates regional wall motion abnormalities (see scoring  diagram/findings for description). Left  ventricular diastolic parameters are indeterminate.   2. Right ventricular systolic function is normal. The right ventricular  size is normal. There is normal pulmonary artery systolic pressure.   3. Left atrial size was moderately dilated.   4. The mitral valve is normal in structure. Mild mitral valve  regurgitation. No evidence of mitral stenosis.   5. The aortic valve is tricuspid. Aortic valve regurgitation is trivial.  Mild aortic valve sclerosis is present, with no evidence of aortic valve  stenosis.   6. The inferior vena cava is normal in size with greater than 50%  respiratory variability, suggesting right atrial pressure of 3 mmHg.     Risk Assessment/Calculations:    Physical Exam:   VS:  There were no vitals taken for this visit.   Wt Readings from Last 3 Encounters:  09/19/23 201 lb (91.2 kg)  08/05/23 200 lb (90.7 kg)  07/25/23 200 lb (90.7 kg)    GEN: Well nourished, well developed in no acute distress NECK: No JVD; No carotid bruits CARDIAC: RRR, no murmurs, rubs, gallops RESPIRATORY:  CTA b/l  without rales, wheezing or rhonchi  ABDOMEN: Soft, non-tender, non-distended EXTREMITIES:  No edema; No deformity    ASSESSMENT AND PLAN: .    paroxysmal AFib CHA2DS2Vasc is 2 (stroke), on eliquis, dosed appropriately  Discussed stopping diltiazem She had a lot of atrial arrhythmias and I didn't feel strongly about stopping it She does not have formal dx of HTN, though BP is not low  on it either She has RBBB (QRS 106) Off BB only a day or two now she is a bit weary of stopping though would like to keep stopping it in mind for the future.  Continue OAC given stroke hx    Secondary hypercoagulable state 2/2 AFib    Dispo: back in 3 mo, sooner if needed  Signed, Sheilah Pigeon, PA-C

## 2023-09-24 ENCOUNTER — Ambulatory Visit: Payer: Managed Care, Other (non HMO) | Attending: Physician Assistant | Admitting: Physician Assistant

## 2023-09-24 VITALS — BP 120/60 | HR 66 | Ht 64.0 in | Wt 201.2 lb

## 2023-09-24 DIAGNOSIS — I4510 Unspecified right bundle-branch block: Secondary | ICD-10-CM | POA: Diagnosis not present

## 2023-09-24 DIAGNOSIS — D6869 Other thrombophilia: Secondary | ICD-10-CM | POA: Diagnosis not present

## 2023-09-24 DIAGNOSIS — I480 Paroxysmal atrial fibrillation: Secondary | ICD-10-CM | POA: Diagnosis not present

## 2023-09-24 DIAGNOSIS — I483 Typical atrial flutter: Secondary | ICD-10-CM

## 2023-09-24 NOTE — Patient Instructions (Signed)
Medication Instructions:   Your physician recommends that you continue on your current medications as directed. Please refer to the Current Medication list given to you today.  *If you need a refill on your cardiac medications before your next appointment, please call your pharmacy*   Lab Work: NONE ORDERED  TODAY    If you have labs (blood work) drawn today and your tests are completely normal, you will receive your results only by: MyChart Message (if you have MyChart) OR A paper copy in the mail If you have any lab test that is abnormal or we need to change your treatment, we will call you to review the results.   Testing/Procedures: NONE ORDERED  TODAY    Follow-Up: At [facility], you and your health needs are our priority.  As part of our continuing mission to provide you with exceptional heart care, we have created designated Provider Care Teams.  These Care Teams include your primary Cardiologist (physician) and Advanced Practice Providers (APPs -  Physician Assistants and Nurse Practitioners) who all work together to provide you with the care you need, when you need it.  We recommend signing up for the patient portal called "MyChart".  Sign up information is provided on this After Visit Summary.  MyChart is used to connect with patients for Virtual Visits (Telemedicine).  Patients are able to view lab/test results, encounter notes, upcoming appointments, etc.  Non-urgent messages can be sent to your provider as well.   To learn more about what you can do with MyChart, go to [email or web address].    Your next appointment:    3 month(s)  Provider:    You may see Lanier Prude, MD or one of the following Advanced Practice Providers on your designated Care Team:   Francis Dowse, [place]  Other Instructions:

## 2023-10-16 ENCOUNTER — Other Ambulatory Visit (HOSPITAL_BASED_OUTPATIENT_CLINIC_OR_DEPARTMENT_OTHER): Payer: Self-pay | Admitting: Cardiology

## 2023-10-16 ENCOUNTER — Other Ambulatory Visit: Payer: Self-pay

## 2023-10-16 DIAGNOSIS — I4892 Unspecified atrial flutter: Secondary | ICD-10-CM

## 2023-10-16 MED ORDER — DILTIAZEM HCL ER COATED BEADS 120 MG PO CP24: ORAL_CAPSULE | ORAL | 3 refills | Status: DC

## 2023-11-28 ENCOUNTER — Other Ambulatory Visit (HOSPITAL_BASED_OUTPATIENT_CLINIC_OR_DEPARTMENT_OTHER): Payer: Self-pay | Admitting: Cardiology

## 2023-12-05 ENCOUNTER — Other Ambulatory Visit (HOSPITAL_BASED_OUTPATIENT_CLINIC_OR_DEPARTMENT_OTHER): Payer: Self-pay | Admitting: Cardiology

## 2023-12-05 DIAGNOSIS — I4891 Unspecified atrial fibrillation: Secondary | ICD-10-CM

## 2023-12-05 NOTE — Telephone Encounter (Signed)
 Prescription refill request for Eliquis received. Indication:afib Last office visit:12/24 Scr:0.84  10/24 Age: 58 Weight:91.3  kg  Prescription refilled

## 2023-12-21 NOTE — Progress Notes (Unsigned)
 Cardiology Office Note:  .   Date:  12/21/2023  ID:  Jane Sparks, DOB 05/29/1966, MRN [id number] PCP: Cyndia Skeeters, DO  [facility] Providers Cardiologist:  Jodelle Red, MD Electrophysiologist:  Lanier Prude, MD {  History of Present Illness: .   Jane Sparks is a 58 y.o. female w/PMHx of stroke, AFib/flutter, RBBB Hx of ETOH, tobacco  She saw Dr. Lalla Brothers 04/17/23, pt preferred to avoid further meds and planned for ablation for rhythm management  Ablation 06/25/23  AFib clinic 07/23/23, reported out of rhythm a couple weeks, more SOB in AFib, EKG reported as AFlutter and planned for DCCV  DCCV 07/25/23 > NSR  AFib clinic 08/05/23, remained in SR, feeling better, had not been as active as usual with suspect some degree of deconditioning.  Thought towards consideration of reducing BB dose for some fatigue.  Saw Dr. Cristal Deer 09/19/23 : better exertional capacity, feeling well, able to do 2 flights of stairs without stopping, previously unable to do one Weaned off her BB   I saw her 09/24/23 She was doing well Off BB, on CCB Recommended lifelong OAC given stroke Consideration of stopping her dilt though ultimately continued  Today's visit is scheduled as a 3 mo post ablation visit  ROS:   January she was quite ill had Norovirus then the flu, spent a week or more really very ill Since then she has felt more winded again, poor exertional capacity, thought initially was just post viral but now even months later, remains with SOB stairs and feels like she did when she was out of rhythm. And suspects she has been back in AFlutter  No SOB with flat walking, no CP, no overt awareness of palpitations No near syncope or syncope Reports excellent Eliquis compliance, no missed doses    Arrhythmia/AAD hx AFlutter noted Jan 2021 AFib found June 2024 Ablation 06/25/23: PVI, posterior wall ablation of atypical atrial flutter utilizing the Right PV's  and roof  CTI line  Studies Reviewed: Marland Kitchen    EKG done today and reviewed by myself SR 67bpm, RBBB   06/25/23: EPS/ablation CONCLUSIONS: 1. Successful PVI 2. Successful ablation/isolation of the posterior wall 3. Successful ablation of atypical atrial flutter utilizing the Right PV's and roof 4. Successful ablation of the cavotricuspid isthmus for typical atrial flutter 5. Intracardiac echo reveals trivial pericardial effusion, normal LA architecture 6. No early apparent complications. 7. Colchicine 0.6mg  PO BID x 5 days 8. Protonix 40mg  PO daily x 45 days   03/23/2020: TTE  1. Inferior basal hypokinesis . Left ventricular ejection fraction, by  estimation, is 55%. The left ventricle has normal function. The left  ventricle demonstrates regional wall motion abnormalities (see scoring  diagram/findings for description). Left  ventricular diastolic parameters are indeterminate.   2. Right ventricular systolic function is normal. The right ventricular  size is normal. There is normal pulmonary artery systolic pressure.   3. Left atrial size was moderately dilated.   4. The mitral valve is normal in structure. Mild mitral valve  regurgitation. No evidence of mitral stenosis.   5. The aortic valve is tricuspid. Aortic valve regurgitation is trivial.  Mild aortic valve sclerosis is present, with no evidence of aortic valve  stenosis.   6. The inferior vena cava is normal in size with greater than 50%  respiratory variability, suggesting right atrial pressure of 3 mmHg.     Risk Assessment/Calculations:    Physical Exam:   VS:  There were no vitals  taken for this visit.   Wt Readings from Last 3 Encounters:  09/24/23 201 lb 3.2 oz (91.3 kg)  09/19/23 201 lb (91.2 kg)  08/05/23 200 lb (90.7 kg)    GEN: Well nourished, well developed in no acute distress NECK: No JVD; No carotid bruits CARDIAC: RRR, no murmurs, rubs, gallops RESPIRATORY: CTA b/l without rales, wheezing or  rhonchi  ABDOMEN: Soft, non-tender, non-distended EXTREMITIES:  No edema; No deformity    ASSESSMENT AND PLAN: .    paroxysmal AFib Atypical aflutter CHA2DS2Vasc is 2 (stroke), on eliquis, dosed appropriately  She is in SR today A couple watch tracings she has made some motion, but appear to be SR with  ectopy  Plan 1 week monitor to assess rhythm with her symptoms Discussed seeing her PMD for perhaps pulm evaluation    Secondary hypercoagulable state 2/2 AFib    Dispo: back in 4-6 weeks, sooner if needed  Signed, Sheilah Pigeon, PA-C

## 2023-12-23 ENCOUNTER — Ambulatory Visit (INDEPENDENT_AMBULATORY_CARE_PROVIDER_SITE_OTHER)

## 2023-12-23 ENCOUNTER — Ambulatory Visit: Payer: Managed Care, Other (non HMO) | Attending: Physician Assistant | Admitting: Physician Assistant

## 2023-12-23 VITALS — BP 130/80 | HR 80 | Ht 64.0 in | Wt 195.0 lb

## 2023-12-23 DIAGNOSIS — I484 Atypical atrial flutter: Secondary | ICD-10-CM | POA: Diagnosis not present

## 2023-12-23 DIAGNOSIS — I483 Typical atrial flutter: Secondary | ICD-10-CM | POA: Diagnosis not present

## 2023-12-23 DIAGNOSIS — I480 Paroxysmal atrial fibrillation: Secondary | ICD-10-CM | POA: Diagnosis not present

## 2023-12-23 DIAGNOSIS — D6869 Other thrombophilia: Secondary | ICD-10-CM

## 2023-12-23 NOTE — Patient Instructions (Addendum)
 Medication Instructions:    Your physician recommends that you continue on your current medications as directed. Please refer to the Current Medication list given to you today.   *If you need a refill on your cardiac medications before your next appointment, please call your pharmacy*   Lab Work:  NONE ORDERED  TODAY   If you have labs (blood work) drawn today and your tests are completely normal, you will receive your results only by: MyChart Message (if you have MyChart) OR A paper copy in the mail If you have any lab test that is abnormal or we need to change your treatment, we will call you to review the results.    Testing/Procedures:Your physician has recommended that you wear an event monitor. Event monitors are medical devices that record the heart's electrical activity. Doctors most often [place] these monitors to diagnose arrhythmias. Arrhythmias are problems with the speed or rhythm of the heartbeat. The monitor is a small, portable device. You can wear one while you do your normal daily activities. This is usually used to diagnose what is causing palpitations/syncope (passing out).     Follow-Up: At [facility], you and your health needs are our priority.  As part of our continuing mission to provide you with exceptional heart care, we have created designated Provider Care Teams.  These Care Teams include your primary Cardiologist (physician) and Advanced Practice Providers (APPs -  Physician Assistants and Nurse Practitioners) who all work together to provide you with the care you need, when you need it.  We recommend signing up for the patient portal called "MyChart".  Sign up information is provided on this After Visit Summary.  MyChart is used to connect with patients for Virtual Visits (Telemedicine).  Patients are able to view lab/test results, encounter notes, upcoming appointments, etc.  Non-urgent messages can be sent to your provider as well.   To learn more  about what you can do with MyChart, go to [email or web address].    Your next appointment:    4 -6 week(s) ( CONTACT  CASSIE HALL/ ANGELINE HAMMER FOR EP SCHEDULING ISSUES )   Provider:     Steffanie Dunn, MD or Francis Dowse, PA-C    ZIO XT- Long Term Monitor Instructions  Your physician has requested you wear a ZIO patch monitor for  7 days.  This is a single patch monitor. [employer] supplies one patch monitor per enrollment. Additional stickers are not available. Please do not apply patch if you will be having a Nuclear Stress Test,  Echocardiogram, Cardiac CT, MRI, or Chest Xray during the period you would be wearing the  monitor. The patch cannot be worn during these tests. You cannot remove and re-apply the  ZIO XT patch monitor.  Your ZIO patch monitor will be mailed 3 day USPS to your address on file. It may take 3-5 days  to receive your monitor after you have been enrolled.  Once you have received your monitor, please review the enclosed instructions. Your monitor  has already been registered assigning a specific monitor serial # to you.  Billing and Patient Assistance Program Information  We have supplied [employer] with any of your insurance information on file for billing purposes. [employer] offers a sliding scale Patient Assistance Program for patients that do not have  insurance, or whose insurance does not completely cover the cost of the ZIO monitor.  You must apply for the Patient Assistance Program to qualify for this discounted rate.  To apply, please  call [employer] at [phone], select option 4, select option 2, ask to apply for  Patient Assistance Program. Meredeth Ide will ask your household income, and how many people  are in your household. They will quote your out-of-pocket cost based on that information.  [employer] will also be able to set up a 87-month, interest-free payment plan if needed.  Applying the monitor   Shave hair from upper left chest.  Hold abrader  disc by orange tab. Rub abrader in 40 strokes over the upper left chest as  indicated in your monitor instructions.  Clean area with 4 enclosed alcohol pads. Let dry.  Apply patch as indicated in monitor instructions. Patch will be placed under collarbone on left  side of chest with arrow pointing upward.  Rub patch adhesive wings for 2 minutes. Remove white label marked "1". Remove the white  label marked "2". Rub patch adhesive wings for 2 additional minutes.  While looking in a mirror, press and release button in center of patch. A small green light will  flash 3-4 times. This will be your only indicator that the monitor has been turned on.  Do not shower for the first 24 hours. You may shower after the first 24 hours.  Press the button if you feel a symptom. You will hear a small click. Record Date, Time and  Symptom in the Patient Logbook.  When you are ready to remove the patch, follow instructions on the last 2 pages of Patient  Logbook. Stick patch monitor onto the last page of Patient Logbook.  Place Patient Logbook in the blue and white box. Use locking tab on box and tape box closed  securely. The blue and white box has prepaid postage on it. Please place it in the mailbox as  soon as possible. Your physician should have your test results approximately 7 days after the  monitor has been mailed back to [facility].  Call [facility] Customer Care at [phone] if you have questions regarding  your ZIO XT patch monitor. Call them immediately if you see an orange light blinking on your  monitor.  If your monitor falls off in less than 4 days, contact our Monitor department at [phone].  If your monitor becomes loose or falls off after 4 days call [employer] at [phone] for  suggestions on securing your monitor

## 2023-12-23 NOTE — Progress Notes (Unsigned)
 Enrolled patient for a 7 day ZIo XT monitor to be mailed to patients home   Lalla Brothers to read

## 2024-01-09 DIAGNOSIS — I483 Typical atrial flutter: Secondary | ICD-10-CM | POA: Diagnosis not present

## 2024-01-22 NOTE — Progress Notes (Signed)
 Cardiology Office Note:  .   Date:  01/22/2024  ID:  Jane Sparks, DOB 12/23/1965, MRN [id number] PCP: Omar Bibber, DO  [facility] Providers Cardiologist:  Sheryle Donning, MD Electrophysiologist:  Boyce Byes, MD {  History of Present Illness: .   Jane Sparks is a 58 y.o. female w/PMHx of stroke, AFib/flutter, RBBB Hx of ETOH, tobacco  She saw Dr. Marven Slimmer 04/17/23, pt preferred to avoid further meds and planned for ablation for rhythm management  Ablation 06/25/23  AFib clinic 07/23/23, reported out of rhythm a couple weeks, more SOB in AFib, EKG reported as AFlutter and planned for DCCV  DCCV 07/25/23 > NSR  AFib clinic 08/05/23, remained in SR, feeling better, had not been as active as usual with suspect some degree of deconditioning.  Thought towards consideration of reducing BB dose for some fatigue.  Saw Dr. Veryl Gottron 09/19/23 : better exertional capacity, feeling well, able to do 2 flights of stairs without stopping, previously unable to do one Weaned off her BB   I saw her 09/24/23 She was doing well Off BB, on CCB Recommended lifelong OAC given stroke Consideration of stopping her dilt though ultimately continued  I saw her 12/23/23 January she was quite ill had Norovirus then the flu, spent a week or more really very ill Since then she has felt more winded again, poor exertional capacity, thought initially was just post viral but now even months later, remains with SOB stairs and feels like she did when she was out of rhythm. And suspects she has been back in AFlutter No SOB with flat walking, no CP, no overt awareness of palpitations No near syncope or syncope Reports excellent Eliquis  compliance, no missed doses She was in SR this visit, watch tracings with significant artifact though suspect ed to have been sinus Planned to for monitoring to look for any arrhythmia burden Planned for monitoring and discussed f/u with er PMD for  perhaps pulm eval  April 2025 monitor HR 39 - 160, average 67 bpm. 185 nonsustained SVT, longest 13 beats. Occasional supraventricular ectopy, 1%. Frequent ventricular ectopy, 8.3%. No sustained arrhythmias. No atrial fibrillation.   Today's visit is scheduled as a 4-6 week follow up mo post ablation visit ROS:   She is doing "OK" This time of year predictably hard with terrible allergies, sinuses are congested, itchy/watery eyes, the same every year She thinks this is really her trouble right now No symtpoms of her arrhythmia No CP No rest SOB, no DOE unless she uses stairs Not any worse , definitely better post DCCV No near syncope or syncope    Arrhythmia/AAD hx AFlutter noted Jan 2021 AFib found June 2024 Ablation 06/25/23: PVI, posterior wall ablation of atypical atrial flutter utilizing the Right PV's and roof  CTI line  Studies Reviewed: Aaron Aas    EKG not done today   06/25/23: EPS/ablation CONCLUSIONS: 1. Successful PVI 2. Successful ablation/isolation of the posterior wall 3. Successful ablation of atypical atrial flutter utilizing the Right PV's and roof 4. Successful ablation of the cavotricuspid isthmus for typical atrial flutter 5. Intracardiac echo reveals trivial pericardial effusion, normal LA architecture 6. No early apparent complications. 7. Colchicine  0.6mg  PO BID x 5 days 8. Protonix  40mg  PO daily x 45 days   03/23/2020: TTE  1. Inferior basal hypokinesis . Left ventricular ejection fraction, by  estimation, is 55%. The left ventricle has normal function. The left  ventricle demonstrates regional wall motion abnormalities (see scoring  diagram/findings  for description). Left  ventricular diastolic parameters are indeterminate.   2. Right ventricular systolic function is normal. The right ventricular  size is normal. There is normal pulmonary artery systolic pressure.   3. Left atrial size was moderately dilated.   4. The mitral valve is normal  in structure. Mild mitral valve  regurgitation. No evidence of mitral stenosis.   5. The aortic valve is tricuspid. Aortic valve regurgitation is trivial.  Mild aortic valve sclerosis is present, with no evidence of aortic valve  stenosis.   6. The inferior vena cava is normal in size with greater than 50%  respiratory variability, suggesting right atrial pressure of 3 mmHg.     Risk Assessment/Calculations:    Physical Exam:   VS:  There were no vitals taken for this visit.   Wt Readings from Last 3 Encounters:  12/23/23 195 lb (88.5 kg)  09/24/23 201 lb 3.2 oz (91.3 kg)  09/19/23 201 lb (91.2 kg)    GEN: Well nourished, well developed in no acute distress NECK: No JVD; No carotid bruits CARDIAC: RRR, no murmurs, rubs, gallops RESPIRATORY: CTA b/l without rales, wheezing or rhonchi  ABDOMEN: Soft, non-tender, non-distended EXTREMITIES:No edema; No deformity    ASSESSMENT AND PLAN: .    paroxysmal AFib Atypical aflutter CHA2DS2Vasc is 2 (stroke), on eliquis , dosed appropriately  No changes today No clear symptoms of arrhythmias    Secondary hypercoagulable state 2/2 AFib    Dispo:  back in 3-37mo,  sooner if needed  Signed, Debbie Fails, PA-C

## 2024-01-26 ENCOUNTER — Ambulatory Visit: Attending: Physician Assistant | Admitting: Physician Assistant

## 2024-01-26 ENCOUNTER — Encounter: Payer: Self-pay | Admitting: Physician Assistant

## 2024-01-26 VITALS — BP 138/68 | HR 68 | Ht 64.0 in | Wt 198.0 lb

## 2024-01-26 DIAGNOSIS — D6869 Other thrombophilia: Secondary | ICD-10-CM

## 2024-01-26 DIAGNOSIS — I480 Paroxysmal atrial fibrillation: Secondary | ICD-10-CM | POA: Diagnosis not present

## 2024-01-26 NOTE — Patient Instructions (Addendum)
 Medication Instructions:   Your physician recommends that you continue on your current medications as directed. Please refer to the Current Medication list given to you today.  *If you need a refill on your cardiac medications before your next appointment, please call your pharmacy*  Lab Work: NONE ORDERED  TODAY    If you have labs (blood work) drawn today and your tests are completely normal, you will receive your results only by: MyChart Message (if you have MyChart) OR A paper copy in the mail If you have any lab test that is abnormal or we need to change your treatment, we will call you to review the results.  Testing/Procedures: NONE ORDERED  TODAY    Follow-Up: At [facility], you and your health needs are our priority.  As part of our continuing mission to provide you with exceptional heart care, our providers are all part of one team.  This team includes your primary Cardiologist (physician) and Advanced Practice Providers or APPs (Physician Assistants and Nurse Practitioners) who all work together to provide you with the care you need, when you need it.  Your next appointment:   3 -4 month(s) ( CONTACT  CASSIE HALL/ ANGELINE HAMMER FOR EP SCHEDULING ISSUES )   Provider:   Harvie Liner, MD or Mertha Abrahams, PA-C    We recommend signing up for the patient portal called "MyChart".  Sign up information is provided on this After Visit Summary.  MyChart is used to connect with patients for Virtual Visits (Telemedicine).  Patients are able to view lab/test results, encounter notes, upcoming appointments, etc.  Non-urgent messages can be sent to your provider as well.   To learn more about what you can do with MyChart, go to [email or web address].   Other Instructions       1st Floor: - Lobby - Registration  - Pharmacy  - Lab - Cafe  2nd Floor: - PV Lab - Diagnostic Testing (echo, CT, nuclear med)  3rd Floor: - Vacant  4th Floor: - TCTS  (cardiothoracic surgery) - AFib Clinic - Structural Heart Clinic - Vascular Surgery  - Vascular Ultrasound  5th Floor: - HeartCare Cardiology (general and EP) - Clinical Pharmacy for coumadin, hypertension, lipid, weight-loss medications, and med management appointments    Valet parking services will be available as well.

## 2024-05-16 NOTE — Progress Notes (Unsigned)
 Cardiology Office Note:  .   Date:  05/16/2024  ID:  Jane Sparks, DOB 1966/04/13, MRN [id number] PCP: Jane Domino, DO  [facility] Providers Cardiologist:  Jane Bruckner, MD Electrophysiologist:  Jane ONEIDA HOLTS, MD {  History of Present Illness: .   Jane Sparks is a 58 y.o. female w/PMHx of stroke, AFib/flutter, RBBB Hx of ETOH, tobacco  She saw Dr. HOLTS 04/17/23, pt preferred to avoid further meds and planned for ablation for rhythm management  Ablation 06/25/23  AFib clinic 07/23/23, reported out of rhythm a couple weeks, more SOB in AFib, EKG reported as AFlutter and planned for DCCV  DCCV 07/25/23 > NSR  AFib clinic 08/05/23, remained in SR, feeling better, had not been as active as usual with suspect some degree of deconditioning.  Thought towards consideration of reducing BB dose for some fatigue.  Saw Dr. Bruckner 09/19/23 : better exertional capacity, feeling well, able to do 2 flights of stairs without stopping, previously unable to do one Weaned off her BB   I saw her 09/24/23 She was doing well Off BB, on CCB Recommended lifelong OAC given stroke Consideration of stopping her dilt though ultimately continued  I saw her 12/23/23 January she was quite ill had Norovirus then the flu, spent a week or more really very ill Since then she has felt more winded again, poor exertional capacity, thought initially was just post viral but now even months later, remains with SOB stairs and feels like she did when she was out of rhythm. And suspects she has been back in AFlutter No SOB with flat walking, no CP, no overt awareness of palpitations No near syncope or syncope Reports excellent Eliquis  compliance, no missed doses She was in SR this visit, watch tracings with significant artifact though suspect ed to have been sinus Planned to for monitoring to look for any arrhythmia burden Planned for monitoring and discussed f/u with er PMD for  perhaps pulm eval  April 2025 monitor HR 39 - 160, average 67 bpm. 185 nonsustained SVT, longest 13 beats. Occasional supraventricular ectopy, 1%. Frequent ventricular ectopy, 8.3%. No sustained arrhythmias. No atrial fibrillation.  I saw her 01/26/24:  Allergies were really bothering her, otherwise doing OK No changes were made Planned to see her in 4 mo   Today's visit is scheduled as a 4-6 week follow up mo post ablation visit ROS:   *** symptoms, arrhythmia *** eliquis , dose, bleeding, labs *** No symtpoms of her arrhythmia No CP No rest SOB, no DOE unless she uses stairs Not any worse , definitely better post DCCV No near syncope or syncope    Arrhythmia/AAD hx AFlutter noted Jan 2021 AFib found June 2024 Ablation 06/25/23: PVI, posterior wall ablation of atypical atrial flutter utilizing the Right PV's and roof  CTI line  Studies Reviewed: Jane Sparks    EKG not done today   06/25/23: EPS/ablation CONCLUSIONS: 1. Successful PVI 2. Successful ablation/isolation of the posterior wall 3. Successful ablation of atypical atrial flutter utilizing the Right PV's and roof 4. Successful ablation of the cavotricuspid isthmus for typical atrial flutter 5. Intracardiac echo reveals trivial pericardial effusion, normal LA architecture 6. No early apparent complications. 7. Colchicine  0.6mg  PO BID x 5 days 8. Protonix  40mg  PO daily x 45 days   03/23/2020: TTE  1. Inferior basal hypokinesis . Left ventricular ejection fraction, by  estimation, is 55%. The left ventricle has normal function. The left  ventricle demonstrates regional wall motion abnormalities (see  scoring  diagram/findings for description). Left  ventricular diastolic parameters are indeterminate.   2. Right ventricular systolic function is normal. The right ventricular  size is normal. There is normal pulmonary artery systolic pressure.   3. Left atrial size was moderately dilated.   4. The mitral valve is  normal in structure. Mild mitral valve  regurgitation. No evidence of mitral stenosis.   5. The aortic valve is tricuspid. Aortic valve regurgitation is trivial.  Mild aortic valve sclerosis is present, with no evidence of aortic valve  stenosis.   6. The inferior vena cava is normal in size with greater than 50%  respiratory variability, suggesting right atrial pressure of 3 mmHg.     Risk Assessment/Calculations:    Physical Exam:   VS:  There were no vitals taken for this visit.   Wt Readings from Last 3 Encounters:  01/26/24 198 lb (89.8 kg)  12/23/23 195 lb (88.5 kg)  09/24/23 201 lb 3.2 oz (91.3 kg)    GEN: Well nourished, well developed in no acute distress NECK: No JVD; No carotid bruits CARDIAC: *** RRR, no murmurs, rubs, gallops RESPIRATORY: *** CTA b/l without rales, wheezing or rhonchi  ABDOMEN: Soft, non-tender, non-distended EXTREMITIES: *** No edema; No deformity    ASSESSMENT AND PLAN: .    paroxysmal AFib Atypical aflutter CHA2DS2Vasc is 2 (stroke), on eliquis , *** dosed appropriately  *** No changes today *** No clear symptoms of arrhythmias    Secondary hypercoagulable state 2/2 AFib    Dispo:  back in *** mo,  sooner if needed  Signed, Jane Macario Arthur, PA-C

## 2024-05-18 ENCOUNTER — Encounter: Payer: Self-pay | Admitting: Physician Assistant

## 2024-05-18 ENCOUNTER — Ambulatory Visit: Attending: Physician Assistant | Admitting: Physician Assistant

## 2024-05-18 VITALS — BP 114/76 | Ht 64.0 in | Wt 199.0 lb

## 2024-05-18 DIAGNOSIS — D6869 Other thrombophilia: Secondary | ICD-10-CM

## 2024-05-18 DIAGNOSIS — R0602 Shortness of breath: Secondary | ICD-10-CM | POA: Diagnosis not present

## 2024-05-18 DIAGNOSIS — I480 Paroxysmal atrial fibrillation: Secondary | ICD-10-CM | POA: Diagnosis not present

## 2024-05-18 NOTE — Patient Instructions (Signed)
 Medication Instructions:  Your physician recommends that you continue on your current medications as directed. Please refer to the Current Medication list given to you today.  *If you need a refill on your cardiac medications before your next appointment, please call your pharmacy*  Lab Work: None ordered If you have labs (blood work) drawn today and your tests are completely normal, you will receive your results only by: MyChart Message (if you have MyChart) OR A paper copy in the mail If you have any lab test that is abnormal or we need to change your treatment, we will call you to review the results.  Testing/Procedures: Your physician has requested that you have an echocardiogram. Echocardiography is a painless test that uses sound waves to create images of your heart. It provides your doctor with information about the size and shape of your heart and how well your heart's chambers and valves are working. This procedure takes approximately one hour. There are no restrictions for this procedure. Please do NOT wear cologne, perfume, aftershave, or lotions (deodorant is allowed). Please arrive 15 minutes prior to your appointment time.  Please note: We ask at that you not bring children with you during ultrasound (echo/ vascular) testing. Due to room size and safety concerns, children are not allowed in the ultrasound rooms during exams. Our front office staff cannot provide observation of children in our lobby area while testing is being conducted. An adult accompanying a patient to their appointment will only be allowed in the ultrasound room at the discretion of the ultrasound technician under special circumstances. We apologize for any inconvenience.   Follow-Up: At [facility], you and your health needs are our priority.  As part of our continuing mission to provide you with exceptional heart care, our providers are all part of one team.  This team includes your primary  Cardiologist (physician) and Advanced Practice Providers or APPs (Physician Assistants and Nurse Practitioners) who all work together to provide you with the care you need, when you need it.  Your next appointment:   2-3 month(s)  Provider:   Charlies Arthur, PA-C

## 2024-06-16 ENCOUNTER — Ambulatory Visit (HOSPITAL_COMMUNITY)
Admission: RE | Admit: 2024-06-16 | Discharge: 2024-06-16 | Disposition: A | Discharge status: Home / Self Care | Source: Ambulatory Visit | Attending: Cardiovascular Disease | Admitting: Cardiovascular Disease

## 2024-06-16 DIAGNOSIS — R0602 Shortness of breath: Secondary | ICD-10-CM | POA: Insufficient documentation

## 2024-06-16 LAB — ECHOCARDIOGRAM COMPLETE
Area-P 1/2: 2.16 cm2
S' Lateral: 2.6 cm

## 2024-06-17 ENCOUNTER — Ambulatory Visit: Payer: Self-pay | Admitting: Physician Assistant

## 2024-06-17 NOTE — Addendum Note (Signed)
 Addended by: RUBBIE KERRI HERO on: 06/17/2024 11:46 AM   Modules accepted: Orders

## 2024-06-18 ENCOUNTER — Encounter (HOSPITAL_BASED_OUTPATIENT_CLINIC_OR_DEPARTMENT_OTHER): Payer: Self-pay

## 2024-06-18 NOTE — Telephone Encounter (Signed)
 Called and spoke to patient .  06/22/24 appt is available  at 8:25 am .  Patient is in agreement of the date and time.   Appt scheduled

## 2024-06-21 NOTE — Progress Notes (Signed)
 Cardiology Office Note   Date:  06/27/2024  ID:  Bricia, Taher [birth year], MRN [id number] PCP: Lafe Domino, DO  [facility] Providers Cardiologist:  Shelda Bruckner, MD Electrophysiologist:  OLE ONEIDA HOLTS, MD     History of Present Illness Jane Sparks is a 58 y.o. female with hx of atrial fib/flutter onset 03/2020 s/p ablation 06/2023, CVA (embolic 08/2020 R MCA), RBBB, white coat hypertension, prior tobacco/alcohol use  She had recurrent atrial fib post ablation requiring DCCV 07/25/23.CT prior to ablation with calcium  score of 0. Beta blocker weaned due to fatigue. 01/2024 wore monitor due to recurrent palpitations with no atrial fib though 185 SVT up to 13 beats.   Seen 05/18/24 by EP team noting intermittent exertional dyspnea. Subsequent echo 06/2024 normal LVEF 65-70%, no significant valvular abnormalities. Diltiazem  was discontinued per patient preference.   Presents today for follow up. She is interested in discussing GLP1. She notes inability to exercise due to shortness of breath and allergies. She notes most of her family is heavy. Reports no chest pain, pressure, or tightness. No edema, orthopnea, PND. Reports no palpitations.    Present dietary choices: Breakfast : avocado, toast, scrambled eggs Lunch: salad with meat protein Dinner: protein, starch, veg Drinks: Water, green tea, one soda per day  ROS: Please see the history of present illness.    All other systems reviewed and are negative.   Studies Reviewed      Cardiac Studies & Procedures   ______________________________________________________________________________________________     ECHOCARDIOGRAM  ECHOCARDIOGRAM COMPLETE 06/16/2024  Narrative ECHOCARDIOGRAM REPORT    Patient Name:   Jane Sparks Date of Exam: 06/16/2024 Medical Rec #:  [id number]       Height:       64.0 in Accession #:    [id number]      Weight:       199.0 lb Date of Birth:  November 23, 1965      BSA:           1.952 m Patient Age:    57 years        BP:           114/76 mmHg Patient Gender: F               HR:           65 bpm. Exam Location:  [address]  Procedure: 2D Echo, 3D Echo, Cardiac Doppler, Color Doppler and Strain Analysis (Both Spectral and Color Flow Doppler were utilized during procedure).  Indications:    R06.02 Shortness of Breath  History:        Patient has prior history of Echocardiogram examinations, most recent 03/23/2020. Stroke, Arrythmias:Atrial Fibrillation, Atrial Flutter and RBBB, Signs/Symptoms:Shortness of Breath; Risk Factors:Family History of Coronary Artery Disease and Former Smoker. History of ETOH abuse, Obesity.  Sonographer:    Heather Hawks RDCS Referring Phys: RENEE LYNN URSUY  IMPRESSIONS   1. Left ventricular ejection fraction, by estimation, is 65 to 70%. Left ventricular ejection fraction by 3D volume is 67 %. The left ventricle has normal function. The left ventricle has no regional wall motion abnormalities. Left ventricular diastolic parameters were normal. The average left ventricular global longitudinal strain is -27.0 %. The global longitudinal strain is normal. 2. Right ventricular systolic function is normal. The right ventricular size is normal. There is normal pulmonary artery systolic pressure. The estimated right ventricular systolic pressure is 35.7 mmHg. 3. Left atrial size was mildly dilated. 4. The mitral valve  is normal in structure. No evidence of mitral valve regurgitation. No evidence of mitral stenosis. 5. The aortic valve is normal in structure. Aortic valve regurgitation is not visualized. No aortic stenosis is present. 6. The inferior vena cava is normal in size with greater than 50% respiratory variability, suggesting right atrial pressure of 3 mmHg.  FINDINGS Left Ventricle: Left ventricular ejection fraction, by estimation, is 65 to 70%. Left ventricular ejection fraction by 3D volume is 67 %. The left ventricle  has normal function. The left ventricle has no regional wall motion abnormalities. The average left ventricular global longitudinal strain is -27.0 %. Strain was performed and the global longitudinal strain is normal. The left ventricular internal cavity size was normal in size. There is no left ventricular hypertrophy. Left ventricular diastolic parameters were normal.  Right Ventricle: The right ventricular size is normal. No increase in right ventricular wall thickness. Right ventricular systolic function is normal. There is normal pulmonary artery systolic pressure. The tricuspid regurgitant velocity is 2.86 m/s, and with an assumed right atrial pressure of 3 mmHg, the estimated right ventricular systolic pressure is 35.7 mmHg.  Left Atrium: Left atrial size was mildly dilated.  Right Atrium: Right atrial size was normal in size.  Pericardium: There is no evidence of pericardial effusion.  Mitral Valve: The mitral valve is normal in structure. No evidence of mitral valve regurgitation. No evidence of mitral valve stenosis.  Tricuspid Valve: The tricuspid valve is normal in structure. Tricuspid valve regurgitation is mild . No evidence of tricuspid stenosis.  Aortic Valve: The aortic valve is normal in structure. Aortic valve regurgitation is not visualized. No aortic stenosis is present.  Pulmonic Valve: The pulmonic valve was normal in structure. Pulmonic valve regurgitation is not visualized. No evidence of pulmonic stenosis.  Aorta: The aortic root is normal in size and structure.  Venous: The inferior vena cava is normal in size with greater than 50% respiratory variability, suggesting right atrial pressure of 3 mmHg.  IAS/Shunts: No atrial level shunt detected by color flow Doppler.  Additional Comments: 3D was performed not requiring image post processing on an independent workstation and was normal.   LEFT VENTRICLE PLAX 2D LVIDd:         4.50 cm         Diastology LVIDs:          2.60 cm         LV e' medial:    8.49 cm/s LV PW:         0.90 cm         LV E/e' medial:  12.0 LV IVS:        0.70 cm         LV e' lateral:   13.70 cm/s LVOT diam:     2.10 cm         LV E/e' lateral: 7.4 LV SV:         84 LV SV Index:   43              2D Longitudinal LVOT Area:     3.46 cm        Strain 2D Strain GLS   -29.1 % (A4C): 2D Strain GLS   -24.0 % (A3C): 2D Strain GLS   -27.8 % (A2C): 2D Strain GLS   -27.0 % Avg:  3D Volume EF LV 3D EF:    Left ventricul ar ejection fraction by 3D volume is 67 %.  3D Volume EF: 3D  EF:        67 % LV EDV:       111 ml LV ESV:       37 ml LV SV:        75 ml  RIGHT VENTRICLE RV S prime:     17.20 cm/s TAPSE (M-mode): 2.8 cm RVSP:           35.7 mmHg  LEFT ATRIUM             Index        RIGHT ATRIUM           Index LA diam:        4.70 cm 2.41 cm/m   RA Pressure: 3.00 mmHg LA Vol (A2C):   57.4 ml 29.40 ml/m  RA Area:     19.10 cm LA Vol (A4C):   73.1 ml 37.44 ml/m  RA Volume:   55.20 ml  28.28 ml/m LA Biplane Vol: 66.5 ml 34.06 ml/m AORTIC VALVE LVOT Vmax:   127.00 cm/s LVOT Vmean:  79.750 cm/s LVOT VTI:    0.242 m  AORTA Ao Root diam: 3.00 cm Ao Asc diam:  2.90 cm  MITRAL VALVE                TRICUSPID VALVE MV Area (PHT): cm          TR Peak grad:   32.7 mmHg MV Decel Time: 351 msec     TR Vmax:        286.00 cm/s MV E velocity: 102.00 cm/s  Estimated RAP:  3.00 mmHg MV A velocity: 28.50 cm/s   RVSP:           35.7 mmHg MV E/A ratio:  3.58 SHUNTS Systemic VTI:  0.24 m Systemic Diam: 2.10 cm  Oneil Parchment MD Electronically signed by Oneil Parchment MD Signature Date/Time: 06/16/2024/4:34:07 PM    Final    MONITORS  LONG TERM MONITOR (3-14 DAYS) 01/08/2024  Narrative HR 39 - 160, average 67 bpm. 185 nonsustained SVT, longest 13 beats. Occasional supraventricular ectopy, 1%. Frequent ventricular ectopy, 8.3%. No sustained arrhythmias. No atrial fibrillation.  Ole T. Cindie, MD,  [facility], [facility] Cardiac Electrophysiology       ______________________________________________________________________________________________      Risk Assessment/Calculations  CHA2DS2-VASc Score = 3   This indicates a 3.2% annual risk of stroke. The patient's score is based upon: CHF History: 0 HTN History: 0 Diabetes History: 0 Stroke History: 2 Vascular Disease History: 0 Age Score: 0 Gender Score: 1       STOP-Bang Score:  4      Physical Exam VS:  BP 130/80 (BP Location: Left Arm, Patient Position: Sitting, Cuff Size: Large)   Pulse 61   Resp 17   Ht 5' 4 (1.626 m)   Wt 198 lb (89.8 kg)   SpO2 96%   BMI 33.99 kg/m        Wt Readings from Last 3 Encounters:  06/22/24 198 lb (89.8 kg)  05/18/24 199 lb (90.3 kg)  01/26/24 198 lb (89.8 kg)    GEN: Well nourished, well developed in no acute distress NECK: No JVD; No carotid bruits CARDIAC: RRR, no murmurs, rubs, gallops RESPIRATORY:  Clear to auscultation without rales, wheezing or rhonchi  ABDOMEN: Soft, non-tender, non-distended EXTREMITIES:  No edema; No deformity   ASSESSMENT AND PLAN  PAF / atrial flutter / Hypercoagulable state - Follows with EP. Continue eliquis  5mg  BID.   DOE - Suspect deconditioning as etiology.  Recent echo unremarkable.   Obesity - Weight loss via diet and exercise encouraged. Discussed the impact being overweight would have on cardiovascular risk. Rx zepbound , prior auth submitted. Recommend aiming for 150 minutes of moderate intensity activity per week and following a heart healthy diet.    Asthma / Allergy - Referral placed to Allergy & Asthma.  History of CVA - continue Atorvastatin  40mg  daily. No asa due ot OAC.   RBBB - no lightheadedness, dizziness. Monitor with periodic EKG.        Dispo: follow up in 2-3 months  Signed, Reche GORMAN Finder, NP

## 2024-06-22 ENCOUNTER — Encounter (HOSPITAL_BASED_OUTPATIENT_CLINIC_OR_DEPARTMENT_OTHER): Payer: Self-pay | Admitting: Family

## 2024-06-22 ENCOUNTER — Other Ambulatory Visit (HOSPITAL_BASED_OUTPATIENT_CLINIC_OR_DEPARTMENT_OTHER): Payer: Self-pay | Admitting: Family

## 2024-06-22 ENCOUNTER — Telehealth: Payer: Self-pay

## 2024-06-22 ENCOUNTER — Ambulatory Visit (HOSPITAL_BASED_OUTPATIENT_CLINIC_OR_DEPARTMENT_OTHER): Admitting: Family

## 2024-06-22 ENCOUNTER — Other Ambulatory Visit (HOSPITAL_COMMUNITY): Payer: Self-pay

## 2024-06-22 DIAGNOSIS — D6859 Other primary thrombophilia: Secondary | ICD-10-CM | POA: Diagnosis not present

## 2024-06-22 DIAGNOSIS — I4892 Unspecified atrial flutter: Secondary | ICD-10-CM

## 2024-06-22 DIAGNOSIS — Z8673 Personal history of transient ischemic attack (TIA), and cerebral infarction without residual deficits: Secondary | ICD-10-CM

## 2024-06-22 DIAGNOSIS — J3089 Other allergic rhinitis: Secondary | ICD-10-CM

## 2024-06-22 MED ORDER — ZEPBOUND 5 MG/0.5ML ~~LOC~~ SOAJ: 5.0000 mg | SUBCUTANEOUS | 0 refills | Status: DC

## 2024-06-22 MED ORDER — ZEPBOUND 7.5 MG/0.5ML ~~LOC~~ SOAJ: 7.5000 mg | SUBCUTANEOUS | 0 refills | Status: DC

## 2024-06-22 MED ORDER — ZEPBOUND 2.5 MG/0.5ML ~~LOC~~ SOAJ: 2.5000 mg | SUBCUTANEOUS | 0 refills | Status: DC

## 2024-06-22 NOTE — Telephone Encounter (Signed)
-----   Message from Nurse Porter HERO sent at 06/22/2024 10:06 AM EDT ----- Regarding: FW: PA FOR ZEBOUND 2.5 MG, 5 MG, AND 7.5 MG DOSE PER  CAITLIN WALKER, NP This is in addition to previous message:  Zepbound  is for history of stroke and obesity ----- Message ----- From: Gladis Porter HERO, LPN Sent: [id number]   9:23 AM EDT To: Rx Prior Auth Team; Rx Med Assistance Team Subject: PA FOR ZEBOUND 2.5 MG, 5 MG, AND 7.5 MG DOSE#  Reche Finder, NP saw this pt in clinic today and wants to start her on zepbound  2.5 mg dose first, then 5 mg followed by the 7.5 mg dose thereafter  Can you obtain the PA's for these please?  We already sent to her pharmacy but she will not pick up until we get the determination of the PA's for each   Thanks, Porter

## 2024-06-22 NOTE — Telephone Encounter (Signed)
 Per test claim drug is not covered on plan, drug is plan exclusion.

## 2024-06-22 NOTE — Patient Instructions (Addendum)
 Medication Instructions:   tirzepatide  (ZEPBOUND ) 2.5 MG/0.5ML Pen -- Inject 2.5 mg into the skin once a week.,   tirzepatide  (ZEPBOUND ) 5 MG/0.5ML Pen -- Inject 5 mg into the skin once a week. Times 4 weeks then increase to 7.5 mg,   tirzepatide  (ZEPBOUND ) 7.5 MG/0.5ML Pen --Inject 7.5 mg into the skin once a week. After completing 5 mg Rx,   *If you need a refill on your cardiac medications before your next appointment, please call your pharmacy*   You have been referred to Ambulatory referral to Allergy --THEIR OFFICE WILL BE THE ONE'S TO CALL YOU TO ARRANGE THIS APPOINTMENT (REFERRAL PLACED)    Follow-Up:  2-3 MONTHS WITH DR CHRISTOPHER OR CAITLIN WALKER, NP    You have been prescribed a GLP-1 today called Zepbound .  This is a once weekly injection that helps you lose weight and maintain weight loss.  This medication works best when combined with healthy diet and regular exercise.  GLP1 medications help you lose weight by reducing appetite and helping you feel fuller longer. Most common side effects include nausea, vomiting, diarrhea. These side effects can be limited by eating slowly and eating small meals and often improve after a few days. We have Sent a prescription and are awaiting prior authorization from insurance.   Exercise recommendations: The American Heart Association recommends 150 minutes of moderate intensity exercise weekly. Try 30 minutes of moderate intensity exercise 4-5 times per week. This could include walking, jogging, or swimming.  Try [employer]s on Youtube

## 2024-06-22 NOTE — Telephone Encounter (Signed)
 Clinic aware, drug not covered.

## 2024-06-22 NOTE — Telephone Encounter (Signed)
 Will make Reche Finder, NP aware of non-coverage for Zepbound .

## 2024-06-22 NOTE — Telephone Encounter (Signed)
 Pharmacy Patient Advocate Encounter   Received notification from Physician's Office that prior authorization for ZEPBOUND  is required/requested.   Insurance verification completed.     Per test claim: DRUG IS NOT COVERED, PLAN BENEFIT EXCLUSION

## 2024-07-19 ENCOUNTER — Ambulatory Visit: Admitting: Physician Assistant

## 2024-07-22 ENCOUNTER — Encounter: Payer: Self-pay | Admitting: Allergy

## 2024-07-22 ENCOUNTER — Ambulatory Visit: Admitting: Allergy

## 2024-07-22 VITALS — BP 126/72 | HR 66 | Temp 98.1°F | Resp 18 | Ht 63.5 in | Wt 197.8 lb

## 2024-07-22 DIAGNOSIS — J4520 Mild intermittent asthma, uncomplicated: Secondary | ICD-10-CM

## 2024-07-22 DIAGNOSIS — J3089 Other allergic rhinitis: Secondary | ICD-10-CM

## 2024-07-22 DIAGNOSIS — K219 Gastro-esophageal reflux disease without esophagitis: Secondary | ICD-10-CM

## 2024-07-22 DIAGNOSIS — R0602 Shortness of breath: Secondary | ICD-10-CM

## 2024-07-22 MED ORDER — LEVALBUTEROL TARTRATE 45 MCG/ACT IN AERO: 2.0000 | INHALATION_SPRAY | RESPIRATORY_TRACT | 2 refills | Status: AC | PRN

## 2024-07-22 MED ORDER — ASMANEX HFA 100 MCG/ACT IN AERO: 1.0000 | INHALATION_SPRAY | Freq: Two times a day (BID) | RESPIRATORY_TRACT | 3 refills | Status: AC

## 2024-07-22 MED ORDER — FLUTICASONE PROPIONATE 50 MCG/ACT NA SUSP
1.0000 | Freq: Every day | NASAL | 5 refills | Status: AC | PRN
Start: 2024-07-22 — End: ?

## 2024-07-22 MED ORDER — OMEPRAZOLE 40 MG PO CPDR: 40.0000 mg | DELAYED_RELEASE_CAPSULE | Freq: Every day | ORAL | 1 refills | Status: DC

## 2024-07-22 NOTE — Progress Notes (Signed)
 New Patient Note  RE: Jane Sparks MRN: [id number] DOB: 05-12-1966 Date of Office Visit: 07/22/2024  Consult requested by: Vannie Reche RAMAN, NP Primary care provider: Lafe Domino, DO  Chief Complaint: Establish Care (Allergic to trees, weeds, grasses moved from [place] 10years ago reaction very bad in clinic required an epi )  History of Present Illness: I had the pleasure of seeing Jane Sparks for initial evaluation at [facility]  on 07/22/2024. She is a 58 y.o. female, who is referred here by Vannie Reche RAMAN, NP (cardiologist) for the evaluation of environmental allergies.  Discussed the use of AI scribe software for clinical note transcription with the patient, who gave verbal consent to proceed.    She experiences persistent shortness of breath that occurs intermittently and is exacerbated by physical exertion such as bending over, standing up quickly, or climbing stairs. The shortness of breath is reported by the patient to be worse during high pollen seasons, particularly in the spring and fall. No wheezing is present, but she experiences a cough in the morning and when overexerted. She has not been hospitalized or required emergency care for this issue.  She uses an albuterol  inhaler daily, typically once a day, which provides some relief. She has not used steroid inhalers in the past. She also takes Claritin  or Zyrtec for allergies, but these have become less effective over time. She has tried Allegra and Benadryl , with Benadryl  providing some relief but not suitable for long-term use. She uses eye drops but no nasal sprays.  She has a history of environmental allergies, including tree pollen, weeds, grasses, ragweed, cat, and dust mites, as identified by prior allergy testing in 2016. She previously received allergy shots but discontinued them due to severe localized and systemic reactions. Typical allergy symptoms include runny nose, itchy eyes, and  sneezing, present since her early thirties.  She experiences occasional reflux and takes Pepcid  as needed. No specific food triggers have been identified, but she notes feeling worse after eating sometimes. No known food allergies are reported.  Her past medical history includes atrial fibrillation and two flutter episodes, for which she underwent cardioversion and ablations. She is currently on Eliquis  and a statin. She quit smoking four years ago. Her father had similar allergies and heart problems.  No recent fevers, chills, or rashes. She experiences a stuffy nose and occasional headaches. She denies any recent sinus infections or pneumonia. She has not been diagnosed with asthma.     She reports symptoms of shortness of breath, coughing, rare nocturnal awakenings for many years. Current medications include albuterol  prn which help. She reports not using aerochamber with inhalers. She tried the following inhalers: none. Main triggers are exertion.  In the last month, frequency of symptoms: daily. Frequency of SABA use: daily. Interference with physical activity: yes. Sleep is undisturbed. In the last 12 months, emergency room visits/urgent care visits/doctor office visits or hospitalizations due to respiratory issues: no. In the last 12 months, oral steroids courses: no. Lifetime history of hospitalization for respiratory issues: no. Prior intubations: no. History of pneumonia: no. She was evaluated by allergist in the past. Smoking exposure: quit in 2021.  History of reflux: yes - takes famotidine  prn.   She reports symptoms of nasal congestion, rhinorrhea, sneezing, itchy/watery eyes. Symptoms have been going on for 30+ years. The symptoms are present all year around with worsening in spring, fall. Anosmia: no. Headache: sometimes. She has used zyrtec, Claritin , allegra, benadryl  with minimal  improvement in symptoms. Sinus infections: no. Previous work up includes: skin testing in 2016 showed  multiple positives and was on AIT for less than 1 year due to localized reactions.  Patient required epi after skin testing due to systemic symptoms.  Previous ENT evaluation: no. Previous sinus imaging: 2021 Ct head sinus normal.  History of nasal polyps: no. Last eye exam: 2025.  Referral note: Significant allergies, hoarseness, intermittent dyspnea related to allergens, ?asthma  Assessment and Plan: Jane Sparks is a 58 y.o. female with: Shortness of breath - worse with exertion Mild intermittent reactive airway disease Intermittent shortness of breath, possibly due to physical deconditioning rather than asthma due to relief within minutes of stopping activity.  Today's spirometry was normal with 110cc and 4% improvement in FEV1 post bronchodilator treatment. Clinically feeling slightly improved.  Discussed that I'm uncertain if all her breathing issues are lung related.  We will do 2 month trial of an inhaler and see if it helps. Will use levoalbuterol as a rescue due history of afib and palpitations. If no benefit - will refer to ENT next as well.  Daily controller medication(s): start Asmanex 100mcg 1 puff twice a day with spacer and rinse mouth afterwards. Spacer given and demonstrated proper use with inhaler. Patient understood technique and all questions/concerned were addressed.  May use levoalbuterol rescue inhaler 2 puffs every 4 to 6 hours as needed for shortness of breath, chest tightness, coughing, and wheezing.  Monitor frequency of use - if you need to use it more than twice per week on a consistent basis let us  know.  Get spirometry at next visit.  Other allergic rhinitis Long-standing allergic rhinitis with multiple environmental allergies. Severe reactions to allergy shots and had reaction with skin prick testing in the past. Current antihistamines ineffective. Use Flonase (fluticasone) nasal spray 1-2 sprays per nostril once a day as needed for nasal congestion.  Nasal  saline spray (i.e., Simply Saline) or nasal saline lavage (i.e., NeilMed) is recommended as needed and prior to medicated nasal sprays. Use over the counter antihistamines such as Zyrtec (cetirizine), Claritin  (loratadine ), Allegra (fexofenadine), or Xyzal (levocetirizine) daily as needed. May switch antihistamines every few months. Get bloodwork.   Gastroesophageal reflux disease, unspecified whether esophagitis present Intermittent reflux symptoms possibly contributing to respiratory issues as patient complaints of worsening shortness of breath after eating at times. See handout for lifestyle and dietary modifications. After 1 month, start omeprazole 40mg  once day - nothing to eat or drink for 20-30 minutes afterwards.   Return in about 2 months (around 09/21/2024).  Meds ordered this encounter  Medications   omeprazole (PRILOSEC) 40 MG capsule    Sig: Take 1 capsule (40 mg total) by mouth daily.    Dispense:  30 capsule    Refill:  1   Mometasone Furoate (ASMANEX HFA) 100 MCG/ACT AERO    Sig: Inhale 1 puff into the lungs in the morning and at bedtime. with spacer and rinse mouth afterwards.    Dispense:  13 g    Refill:  3   levalbuterol (XOPENEX HFA) 45 MCG/ACT inhaler    Sig: Inhale 2 puffs into the lungs every 4 (four) hours as needed for wheezing or shortness of breath (coughing fits).    Dispense:  1 each    Refill:  2   fluticasone (FLONASE) 50 MCG/ACT nasal spray    Sig: Place 1-2 sprays into both nostrils daily as needed (nasal congestion).    Dispense:  16 g  Refill:  5   Lab Orders         Allergens w/Total IgE Area 2         CBC with Differential/Platelet         Tryptase      Other allergy screening: Food allergy: no Medication allergy: yes Hymenoptera allergy: no Localized reactions  Urticaria: no Eczema:no History of recurrent infections suggestive of immunodeficency: no  Diagnostics: Spirometry:  Tracings reviewed. Her effort: Good reproducible  efforts. FVC: 2.89L FEV1: 2.41L, 97% predicted FEV1/FVC ratio: 83% Interpretation: Spirometry consistent with normal pattern with 110cc and 4% improvement in FEV1 post bronchodilator treatment. Clinically feeling slightly improved.   Please see scanned spirometry results for details.  Results discussed with patient/family.   Past Medical History: Patient Active Problem List   Diagnosis Date Noted   Hypercoagulable state due to persistent atrial fibrillation (HCC) 08/05/2023   Typical atrial flutter (HCC) 07/23/2023   History of CVA (cerebrovascular accident) 09/14/2021   White coat syndrome without diagnosis of hypertension 09/14/2021   Current severe episode of major depressive disorder without psychotic features (HCC) 08/23/2020   TIA due to embolism (HCC) 08/23/2020   Environmental and seasonal allergies 04/01/2020   Alcohol use    Anxiety    Atrial fibrillation and flutter (HCC)    Elevated LDL cholesterol level    Former smoker    Paroxysmal atrial flutter (HCC) 03/22/2020   Past Medical History:  Diagnosis Date   Alcohol use    Anxiety    Atrial fibrillation with RVR (HCC)    Smoker    Stroke (HCC) 06/2020   TIA   Past Surgical History: Past Surgical History:  Procedure Laterality Date   ATRIAL FIBRILLATION ABLATION N/A 06/25/2023   Procedure: ATRIAL FIBRILLATION ABLATION;  Surgeon: Cindie Ole DASEN, MD;  Location: [facility] INVASIVE CV LAB;  Service: Cardiovascular;  Laterality: N/A;   CARDIOVERSION N/A 03/07/2023   Procedure: CARDIOVERSION;  Surgeon: Lonni Slain, MD;  Location: [facility] INVASIVE CV LAB;  Service: Cardiovascular;  Laterality: N/A;   CARDIOVERSION N/A 07/25/2023   Procedure: CARDIOVERSION;  Surgeon: Barbaraann Darryle Ned, MD;  Location: [facility] INVASIVE CV LAB;  Service: Cardiovascular;  Laterality: N/A;   Medication List:  Current Outpatient Medications  Medication Sig Dispense Refill   acetaminophen  (TYLENOL ) 500 MG tablet Take 500-1,000 mg by mouth  as needed (pain.).     atorvastatin  (LIPITOR) 40 MG tablet TAKE 1 TABLET BY MOUTH EVERY DAY 90 tablet 3   calcium  carbonate (TUMS EX) 750 MG chewable tablet Chew 1 tablet by mouth daily as needed for heartburn.     Cholecalciferol  (VITAMIN D3) 125 MCG (5000 UT) CAPS Take 5,000 Units by mouth in the morning.     diphenhydrAMINE -zinc acetate (BENADRYL ) cream Apply 1 Application topically 3 (three) times daily as needed (bug bited).     ELIQUIS  5 MG TABS tablet TAKE 1 TABLET BY MOUTH TWICE A DAY 180 tablet 2   famotidine  (PEPCID ) 20 MG tablet Take 20 mg by mouth as needed for heartburn or indigestion.     fluticasone (FLONASE) 50 MCG/ACT nasal spray Place 1-2 sprays into both nostrils daily as needed (nasal congestion). 16 g 5   ketotifen (ZADITOR) 0.035 % ophthalmic solution Place 1 drop into both eyes 2 (two) times daily as needed (allergies.).     levalbuterol (XOPENEX HFA) 45 MCG/ACT inhaler Inhale 2 puffs into the lungs every 4 (four) hours as needed for wheezing or shortness of breath (coughing fits). 1 each 2  loratadine  (CLARITIN ) 10 MG tablet Take 10 mg by mouth at bedtime as needed for allergies.     loteprednol (ALREX) 0.2 % SUSP Place 1 drop into both eyes 2 (two) times daily as needed (allergies).     Mometasone Furoate (ASMANEX HFA) 100 MCG/ACT AERO Inhale 1 puff into the lungs in the morning and at bedtime. with spacer and rinse mouth afterwards. 13 g 3   omeprazole (PRILOSEC) 40 MG capsule Take 1 capsule (40 mg total) by mouth daily. 30 capsule 1   OVER THE COUNTER MEDICATION Take 10 mg by mouth daily as needed (anxiety).  CBD gummies     tirzepatide  (ZEPBOUND ) 2.5 MG/0.5ML Pen Inject 2.5 mg into the skin once a week. 2 mL 0   tirzepatide  (ZEPBOUND ) 5 MG/0.5ML Pen Inject 5 mg into the skin once a week. Times 4 weeks then increase to 7.5 mg 2 mL 0   tirzepatide  (ZEPBOUND ) 7.5 MG/0.5ML Pen Inject 7.5 mg into the skin once a week. After completing 5 mg Rx 2 mL 0   No current  facility-administered medications for this visit.   Allergies: Allergies  Allergen Reactions   Dexamethasone Other (See Comments)    Patient suspects contributing factor to Atrial fibrillation    Doxycycline Other (See Comments)    Patient suspects contributing factor to Atrial fibrillation    Penicillins Rash    Full body rash   Sulfa Antibiotics Rash    Full body rash    Social History: Social History   Socioeconomic History   Marital status: Married    Spouse name: Kayla   Number of children: 2   Years of education: Not on file   Highest education level: Not on file  Occupational History   Occupation: full time  Tobacco Use   Smoking status: Former    Current packs/day: 0.00    Types: Cigarettes    Quit date: 03/2020    Years since quitting: 4.3   Smokeless tobacco: Never   Tobacco comments:    Former smoker stopped after hospital stay 03/24/2020  Vaping Use   Vaping status: Never Used  Substance and Sexual Activity   Alcohol use: Not Currently    Comment: stop drinking x1 year (07-25-2023)   Drug use: Yes    Comment: CDB 10mg  gummies   Sexual activity: Not on file  Other Topics Concern   Not on file  Social History Narrative   Lives with Husband Howard   Right Handed   Drinks 1-3 cups caffeine daily   Social Drivers of Health   Financial Resource Strain: Not on file  Food Insecurity: Not on file  Transportation Needs: Not on file  Physical Activity: Not on file  Stress: Not on file  Social Connections: Not on file   Lives in a house. Smoking: quit in 2021 Occupation: Scientist, clinical (histocompatibility and immunogenetics)  Environmental History: Water Damage/mildew in the house: no Carpet in the family room: no Carpet in the bedroom: yes Heating: gas Cooling: central Pet: yes 1 dog x 14 yrs, 2 cats x 2.5 yrs, 2 weeks  Family History: Family History  Problem Relation Age of Onset   Atrial fibrillation Mother    CVA Mother    Heart disease Father    Problem                                Relation Allergic rhino conjunctivitis     father   Review  of Systems  Constitutional:  Negative for appetite change, chills, fever and unexpected weight change.  HENT:  Positive for congestion. Negative for rhinorrhea.   Eyes:  Negative for itching.  Respiratory:  Positive for cough and shortness of breath. Negative for chest tightness and wheezing.   Cardiovascular:  Negative for chest pain.  Gastrointestinal:  Negative for abdominal pain.  Genitourinary:  Negative for difficulty urinating.  Skin:  Negative for rash.  Neurological:  Negative for headaches.    Objective: BP 126/72   Pulse 66   Temp 98.1 F (36.7 C) (Temporal)   Resp 18   Ht 5' 3.5 (1.613 m)   Wt 197 lb 12.8 oz (89.7 kg)   SpO2 99%   BMI 34.49 kg/m  Body mass index is 34.49 kg/m. Physical Exam Vitals and nursing note reviewed.  Constitutional:      Appearance: Normal appearance. She is well-developed.  HENT:     Head: Normocephalic and atraumatic.     Right Ear: Tympanic membrane and external ear normal.     Left Ear: Tympanic membrane and external ear normal.     Nose: Nose normal.     Mouth/Throat:     Mouth: Mucous membranes are moist.     Pharynx: Oropharynx is clear.  Eyes:     Conjunctiva/sclera: Conjunctivae normal.  Cardiovascular:     Rate and Rhythm: Normal rate and regular rhythm.     Heart sounds: Normal heart sounds. No murmur heard.    No friction rub. No gallop.  Pulmonary:     Effort: Pulmonary effort is normal.     Breath sounds: Normal breath sounds. No wheezing, rhonchi or rales.  Musculoskeletal:     Cervical back: Neck supple.  Skin:    General: Skin is warm.     Findings: No rash.  Neurological:     Mental Status: She is alert and oriented to person, place, and time.  Psychiatric:        Behavior: Behavior normal.    The plan was reviewed with the patient/family, and all questions/concerned were addressed.  It was my pleasure to see Jane Sparks today and  participate in her care. Please feel free to contact me with any questions or concerns.  Sincerely,  Orlan Cramp, DO Allergy & Immunology  [facility]  [facility] office: [phone] [facility] office: [phone]

## 2024-07-22 NOTE — Patient Instructions (Addendum)
 Breathing I'm not convinced all these symptoms are due to your lungs. We will do 2 month trial of an inhaler and see if it helps. If no benefit - will refer to ENT next as well.   Daily controller medication(s): start Asmanex 100mcg 1 puff twice a day with spacer and rinse mouth afterwards. Spacer given and demonstrated proper use with inhaler. Patient understood technique and all questions/concerned were addressed.  May use levoalbuterol rescue inhaler 2 puffs every 4 to 6 hours as needed for shortness of breath, chest tightness, coughing, and wheezing.  Monitor frequency of use - if you need to use it more than twice per week on a consistent basis let us  know.  Breathing control goals:  Full participation in all desired activities (may need albuterol  before activity) Albuterol  use two times or less a week on average (not counting use with activity) Cough interfering with sleep two times or less a month Oral steroids no more than once a year No hospitalizations   Environmental allergies Use Flonase (fluticasone) nasal spray 1-2 sprays per nostril once a day as needed for nasal congestion.  Nasal saline spray (i.e., Simply Saline) or nasal saline lavage (i.e., NeilMed) is recommended as needed and prior to medicated nasal sprays. Use over the counter antihistamines such as Zyrtec (cetirizine), Claritin  (loratadine ), Allegra (fexofenadine), or Xyzal (levocetirizine) daily as needed. May switch antihistamines every few months. Get bloodwork We are ordering labs, so please allow 1-2 weeks for the results to come back. With the newly implemented Cures Act, the labs might be visible to you at the same time that they become visible to me. However, I will not address the results until all of the results are back, so please be patient.  In the meantime, continue recommendations in your patient instructions, including avoidance measures (if applicable), until you hear from me.  Reflux See handout  for lifestyle and dietary modifications. After 1 month, start omeprazole 40mg  once day - nothing to eat or drink for 20-30 minutes afterwards.   Follow up in 2 month or sooner if needed.

## 2024-07-23 ENCOUNTER — Other Ambulatory Visit: Payer: Self-pay | Admitting: Pharmacist

## 2024-07-23 DIAGNOSIS — I4891 Unspecified atrial fibrillation: Secondary | ICD-10-CM

## 2024-07-23 MED ORDER — APIXABAN 5 MG PO TABS: 5.0000 mg | ORAL_TABLET | Freq: Two times a day (BID) | ORAL | 1 refills | Status: AC

## 2024-07-25 LAB — CBC WITH DIFFERENTIAL/PLATELET
Basophils Absolute: 0 x10E3/uL (ref 0.0–0.2)
Basos: 1 %
EOS (ABSOLUTE): 0.2 x10E3/uL (ref 0.0–0.4)
Eos: 4 %
Hematocrit: 43.2 % (ref 34.0–46.6)
Hemoglobin: 13.8 g/dL (ref 11.1–15.9)
Immature Grans (Abs): 0 x10E3/uL (ref 0.0–0.1)
Immature Granulocytes: 0 %
Lymphocytes Absolute: 2.1 x10E3/uL (ref 0.7–3.1)
Lymphs: 37 %
MCH: 28 pg (ref 26.6–33.0)
MCHC: 31.9 g/dL (ref 31.5–35.7)
MCV: 88 fL (ref 79–97)
Monocytes Absolute: 0.4 x10E3/uL (ref 0.1–0.9)
Monocytes: 8 %
Neutrophils Absolute: 2.9 x10E3/uL (ref 1.4–7.0)
Neutrophils: 50 %
Platelets: 310 x10E3/uL (ref 150–450)
RBC: 4.93 x10E6/uL (ref 3.77–5.28)
RDW: 13.6 % (ref 11.7–15.4)
WBC: 5.7 x10E3/uL (ref 3.4–10.8)

## 2024-07-25 LAB — ALLERGENS W/TOTAL IGE AREA 2
Alternaria Alternata IgE: <0.1 kU/L
Aspergillus Fumigatus IgE: <0.1 kU/L
Bermuda Grass IgE: 1.86 kU/L — AB
Cat Dander IgE: <0.1 kU/L
Cedar, Mountain IgE: <0.1 kU/L
Cladosporium Herbarum IgE: <0.1 kU/L
Cockroach, German IgE: <0.1 kU/L
Common Silver Birch IgE: <0.1 kU/L
Cottonwood IgE: <0.1 kU/L
D Farinae IgE: <0.1 kU/L
D Pteronyssinus IgE: <0.1 kU/L
Dog Dander IgE: 1.79 kU/L — AB
Elm, American IgE: <0.1 kU/L
IgE (Immunoglobulin E), Serum: 44 [IU]/mL (ref 6–495)
Johnson Grass IgE: 2.95 kU/L — AB
Maple/Box Elder IgE: <0.1 kU/L
Mouse Urine IgE: <0.1 kU/L
Oak, White IgE: <0.1 kU/L
Pecan, Hickory IgE: <0.1 kU/L
Penicillium Chrysogen IgE: <0.1 kU/L
Pigweed, Rough IgE: <0.1 kU/L
Ragweed, Short IgE: 0.12 kU/L — AB
Sheep Sorrel IgE Qn: <0.1 kU/L
Timothy Grass IgE: 7.25 kU/L — AB
White Mulberry IgE: <0.1 kU/L

## 2024-07-25 LAB — TRYPTASE: Tryptase: 8.6 ug/L (ref 2.2–13.2)

## 2024-07-28 ENCOUNTER — Ambulatory Visit: Payer: Self-pay | Admitting: Allergy

## 2024-08-15 ENCOUNTER — Other Ambulatory Visit: Payer: Self-pay | Admitting: Allergy

## 2024-09-06 ENCOUNTER — Encounter: Payer: Self-pay | Admitting: Allergy

## 2024-09-21 ENCOUNTER — Ambulatory Visit (HOSPITAL_BASED_OUTPATIENT_CLINIC_OR_DEPARTMENT_OTHER): Admitting: Cardiology

## 2024-09-22 NOTE — Progress Notes (Unsigned)
 Follow Up Note  RE: Jane Sparks MRN: [id number] DOB: 1966/05/12 Date of Office Visit: 09/23/2024  Referring provider: Lafe Domino, DO Primary care provider: Lafe Domino, DO  Chief Complaint: No chief complaint on file.  History of Present Illness: I had the pleasure of seeing Jane Sparks for a follow up visit at the [facility] on 09/23/2024. She is a 58 y.o. female, who is being followed for reactive airway disease, allergic rhinitis and GERD. Her previous allergy office visit was on 07/22/2024 with Dr. Luke. Sparks is a regular follow up visit.  Discussed the use of AI scribe software for clinical note transcription with the patient, who gave verbal consent to proceed.  History of Present Illness            2025 labs: Environmental panel positive to dog, grass and borderline to ragweed. See below for environmental control measures. Rest of bloodwork unremarkable.  Continue medications as discussed at last visit.  Assessment and Plan: Jane Sparks is a 58 y.o. female with: Shortness of breath - worse with exertion Mild intermittent reactive airway disease Intermittent shortness of breath, possibly due to physical deconditioning rather than asthma due to relief within minutes of stopping activity.  Sparks's spirometry was normal with 110cc and 4% improvement in FEV1 post bronchodilator treatment. Clinically feeling slightly improved.  Discussed that I'm uncertain if all her breathing issues are lung related.  We will do 2 month trial of an inhaler and see if it helps. Will use levoalbuterol as a rescue due history of afib and palpitations. If no benefit - will refer to ENT next as well.  Daily controller medication(s): start Asmanex  100mcg 1 puff twice a day with spacer and rinse mouth afterwards. Spacer given and demonstrated proper use with inhaler. Patient understood technique and all questions/concerned were addressed.  May use levoalbuterol rescue  inhaler 2 puffs every 4 to 6 hours as needed for shortness of breath, chest tightness, coughing, and wheezing.  Monitor frequency of use - if you need to use it more than twice per week on a consistent basis let us  know.  Get spirometry at next visit.   Other allergic rhinitis Long-standing allergic rhinitis with multiple environmental allergies. Severe reactions to allergy shots and had reaction with skin prick testing in the past. Current antihistamines ineffective. Use Flonase  (fluticasone ) nasal spray 1-2 sprays per nostril once a day as needed for nasal congestion.  Nasal saline spray (i.e., Simply Saline) or nasal saline lavage (i.e., NeilMed) is recommended as needed and prior to medicated nasal sprays. Use over the counter antihistamines such as Zyrtec (cetirizine), Claritin  (loratadine ), Allegra (fexofenadine), or Xyzal (levocetirizine) daily as needed. May switch antihistamines every few months. Get bloodwork.    Gastroesophageal reflux disease, unspecified whether esophagitis present Intermittent reflux symptoms possibly contributing to respiratory issues as patient complaints of worsening shortness of breath after eating at times. See handout for lifestyle and dietary modifications. After 1 month, start omeprazole  40mg  once day - nothing to eat or drink for 20-30 minutes afterwards.  Assessment and Plan              No follow-ups on file.  No orders of the defined types were placed in this encounter.  Lab Orders  No laboratory test(s) ordered Sparks    Diagnostics: Spirometry:  Tracings reviewed. Her effort: {Blank single:19197::Good reproducible efforts.,It was hard to get consistent efforts and there is a question as to whether this reflects a maximal maneuver.,Poor effort, data  can not be interpreted.} FVC: ***L FEV1: ***L, ***% predicted FEV1/FVC ratio: ***% Interpretation: {Blank single:19197::Spirometry consistent with mild obstructive disease,Spirometry  consistent with moderate obstructive disease,Spirometry consistent with severe obstructive disease,Spirometry consistent with possible restrictive disease,Spirometry consistent with mixed obstructive and restrictive disease,Spirometry uninterpretable due to technique,Spirometry consistent with normal pattern,No overt abnormalities noted given Sparks's efforts}.  Please see scanned spirometry results for details.  Skin Testing: {Blank single:19197::Select foods,Environmental allergy panel,Environmental allergy panel and select foods,Food allergy panel,None,Deferred due to recent antihistamines use}. *** Results discussed with patient/family.   Medication List:  Current Outpatient Medications  Medication Sig Dispense Refill   acetaminophen  (TYLENOL ) 500 MG tablet Take 500-1,000 mg by mouth as needed (pain.).     apixaban  (ELIQUIS ) 5 MG TABS tablet Take 1 tablet (5 mg total) by mouth 2 (two) times daily. 180 tablet 1   atorvastatin  (LIPITOR) 40 MG tablet TAKE 1 TABLET BY MOUTH EVERY DAY 90 tablet 3   calcium  carbonate (TUMS EX) 750 MG chewable tablet Chew 1 tablet by mouth daily as needed for heartburn.     Cholecalciferol  (VITAMIN D3) 125 MCG (5000 UT) CAPS Take 5,000 Units by mouth in the morning.     diphenhydrAMINE -zinc acetate (BENADRYL ) cream Apply 1 Application topically 3 (three) times daily as needed (bug bited).     famotidine  (PEPCID ) 20 MG tablet Take 20 mg by mouth as needed for heartburn or indigestion.     fluticasone  (FLONASE ) 50 MCG/ACT nasal spray Place 1-2 sprays into both nostrils daily as needed (nasal congestion). 16 g 5   ketotifen (ZADITOR) 0.035 % ophthalmic solution Place 1 drop into both eyes 2 (two) times daily as needed (allergies.).     levalbuterol  (XOPENEX  HFA) 45 MCG/ACT inhaler Inhale 2 puffs into the lungs every 4 (four) hours as needed for wheezing or shortness of breath (coughing fits). 1 each 2   loratadine  (CLARITIN ) 10 MG tablet Take  10 mg by mouth at bedtime as needed for allergies.     loteprednol (ALREX) 0.2 % SUSP Place 1 drop into both eyes 2 (two) times daily as needed (allergies).     Mometasone Furoate  (ASMANEX  HFA) 100 MCG/ACT AERO Inhale 1 puff into the lungs in the morning and at bedtime. with spacer and rinse mouth afterwards. 13 g 3   omeprazole  (PRILOSEC) 40 MG capsule TAKE 1 CAPSULE (40 MG TOTAL) BY MOUTH DAILY. 90 capsule 1   OVER THE COUNTER MEDICATION Take 10 mg by mouth daily as needed (anxiety).  CBD gummies     tirzepatide  (ZEPBOUND ) 2.5 MG/0.5ML Pen Inject 2.5 mg into the skin once a week. 2 mL 0   tirzepatide  (ZEPBOUND ) 5 MG/0.5ML Pen Inject 5 mg into the skin once a week. Times 4 weeks then increase to 7.5 mg 2 mL 0   tirzepatide  (ZEPBOUND ) 7.5 MG/0.5ML Pen Inject 7.5 mg into the skin once a week. After completing 5 mg Rx 2 mL 0   No current facility-administered medications for this visit.   Allergies: Allergies[1] I reviewed her past medical history, social history, family history, and environmental history and no significant changes have been reported from her previous visit.  Review of Systems  Constitutional:  Negative for appetite change, chills, fever and unexpected weight change.  HENT:  Positive for congestion. Negative for rhinorrhea.   Eyes:  Negative for itching.  Respiratory:  Positive for cough and shortness of breath. Negative for chest tightness and wheezing.   Cardiovascular:  Negative for chest pain.  Gastrointestinal:  Negative for  abdominal pain.  Genitourinary:  Negative for difficulty urinating.  Skin:  Negative for rash.  Allergic/Immunologic: Positive for environmental allergies.  Neurological:  Negative for headaches.    Objective: There were no vitals taken for this visit. There is no height or weight on file to calculate BMI. Physical Exam Vitals and nursing note reviewed.  Constitutional:      Appearance: Normal appearance. She is well-developed.  HENT:      Head: Normocephalic and atraumatic.     Right Ear: Tympanic membrane and external ear normal.     Left Ear: Tympanic membrane and external ear normal.     Nose: Nose normal.     Mouth/Throat:     Mouth: Mucous membranes are moist.     Pharynx: Oropharynx is clear.  Eyes:     Conjunctiva/sclera: Conjunctivae normal.  Cardiovascular:     Rate and Rhythm: Normal rate and regular rhythm.     Heart sounds: Normal heart sounds. No murmur heard.    No friction rub. No gallop.  Pulmonary:     Effort: Pulmonary effort is normal.     Breath sounds: Normal breath sounds. No wheezing, rhonchi or rales.  Musculoskeletal:     Cervical back: Neck supple.  Skin:    General: Skin is warm.     Findings: No rash.  Neurological:     Mental Status: She is alert and oriented to person, place, and time.  Psychiatric:        Behavior: Behavior normal.    Previous notes and tests were reviewed. The plan was reviewed with the patient/family, and all questions/concerned were addressed.  It was my pleasure to see Jane Sparks Sparks and participate in her care. Please feel free to contact me with any questions or concerns.  Sincerely,  Orlan Cramp, DO Allergy & Immunology  [facility]  [place] office: [phone] [facility] office: [phone]    [1]  Allergies Allergen Reactions   Dexamethasone Other (See Comments)    Patient suspects contributing factor to Atrial fibrillation    Doxycycline Other (See Comments)    Patient suspects contributing factor to Atrial fibrillation    Penicillins Rash    Full body rash   Sulfa Antibiotics Rash    Full body rash

## 2024-09-23 ENCOUNTER — Other Ambulatory Visit: Payer: Self-pay

## 2024-09-23 ENCOUNTER — Ambulatory Visit: Admitting: Allergy

## 2024-09-23 ENCOUNTER — Encounter: Payer: Self-pay | Admitting: Allergy

## 2024-09-23 VITALS — BP 122/68 | HR 70 | Temp 97.9°F | Resp 18 | Wt 198.8 lb

## 2024-09-23 DIAGNOSIS — J4520 Mild intermittent asthma, uncomplicated: Secondary | ICD-10-CM | POA: Diagnosis not present

## 2024-09-23 DIAGNOSIS — J3081 Allergic rhinitis due to animal (cat) (dog) hair and dander: Secondary | ICD-10-CM

## 2024-09-23 DIAGNOSIS — J301 Allergic rhinitis due to pollen: Secondary | ICD-10-CM | POA: Diagnosis not present

## 2024-09-23 MED ORDER — MONTELUKAST SODIUM 10 MG PO TABS: 10.0000 mg | ORAL_TABLET | Freq: Every day | ORAL | 5 refills | Status: AC

## 2024-09-23 NOTE — Patient Instructions (Addendum)
 Breathing Normal breathing test today. May use levoalbuterol rescue inhaler 2 puffs every 4 to 6 hours as needed for shortness of breath, chest tightness, coughing, and wheezing.  Monitor frequency of use - if you need to use it more than twice per week on a consistent basis let us  know.  Breathing control goals:  Full participation in all desired activities (may need albuterol  before activity) Albuterol  use two times or less a week on average (not counting use with activity) Cough interfering with sleep two times or less a month Oral steroids no more than once a year No hospitalizations   Environmental allergies 2025 labs positive to dog, grass and borderline to ragweed.  See below for environmental control measures.  If you notice any symptoms then:  Start Singulair  (montelukast ) 10mg  daily at night.  Cautioned that in some children/adults can experience behavioral changes including hyperactivity, agitation, depression, sleep disturbances and suicidal ideations. These side effects are rare, but if you notice them you should notify me and discontinue Singulair  (montelukast ). Use Flonase  (fluticasone ) nasal spray 1-2 sprays per nostril once a day as needed for nasal congestion.  Nasal saline spray (i.e., Simply Saline) or nasal saline lavage (i.e., NeilMed) is recommended as needed and prior to medicated nasal sprays. Use over the counter antihistamines such as Zyrtec (cetirizine), Claritin  (loratadine ), Allegra (fexofenadine), or Xyzal (levocetirizine) daily as needed. May switch antihistamines every few months.  Consider allergy injections for long term control if above medications do not help the symptoms - handout given.  We can start at a lower concentration than the normal starting dose due to your past history of reactions. 1 injection.  Follow up in 3 month or sooner if needed.   Reducing Pollen Exposure Pollen seasons: trees (spring), grass (summer) and ragweed/weeds  (fall). Keep windows closed in your home and car to lower pollen exposure.  Install air conditioning in the bedroom and throughout the house if possible.  Avoid going out in dry windy days - especially early morning. Pollen counts are highest between 5 - 10 AM and on dry, hot and windy days.  Save outside activities for late afternoon or after a heavy rain, when pollen levels are lower.  Avoid mowing of grass if you have grass pollen allergy. Be aware that pollen can also be transported indoors on people and pets.  Dry your clothes in an automatic dryer rather than hanging them outside where they might collect pollen.  Rinse hair and eyes before bedtime.  Pet Allergen Avoidance: Contrary to popular opinion, there are no hypoallergenic breeds of dogs or cats. That is because people are not allergic to an animals hair, but to an allergen found in the animal's saliva, dander (dead skin flakes) or urine. Pet allergy symptoms typically occur within minutes. For some people, symptoms can build up and become most severe 8 to 12 hours after contact with the animal. People with severe allergies can experience reactions in public places if dander has been transported on the pet owners clothing. Keeping an animal outdoors is only a partial solution, since homes with pets in the yard still have higher concentrations of animal allergens. Before getting a pet, ask your allergist to determine if you are allergic to animals. If your pet is already considered part of your family, try to minimize contact and keep the pet out of the bedroom and other rooms where you spend a great deal of time. As with dust mites, vacuum carpets often or replace carpet with a hardwood  floor, tile or linoleum. High-efficiency particulate air (HEPA) cleaners can reduce allergen levels over time. While dander and saliva are the source of cat and dog allergens, urine is the source of allergens from rabbits, hamsters, mice and guinea  pigs; so ask a non-allergic family member to clean the animals cage. If you have a pet allergy, talk to your allergist about the potential for allergy immunotherapy (allergy shots). This strategy can often provide long-term relief.

## 2024-10-21 ENCOUNTER — Encounter (HOSPITAL_BASED_OUTPATIENT_CLINIC_OR_DEPARTMENT_OTHER): Payer: Self-pay

## 2024-10-21 MED ORDER — ZEPBOUND 5 MG/0.5ML ~~LOC~~ SOAJ: 5.0000 mg | SUBCUTANEOUS | 0 refills | Status: DC

## 2024-10-21 MED ORDER — ZEPBOUND 7.5 MG/0.5ML ~~LOC~~ SOAJ: 7.5000 mg | SUBCUTANEOUS | 0 refills | Status: DC

## 2024-10-21 MED ORDER — ZEPBOUND 2.5 MG/0.5ML ~~LOC~~ SOAJ: 2.5000 mg | SUBCUTANEOUS | 0 refills | Status: DC

## 2024-10-22 MED ORDER — ZEPBOUND 5 MG/0.5ML ~~LOC~~ SOAJ: 5.0000 mg | SUBCUTANEOUS | 0 refills | Status: DC

## 2024-10-22 MED ORDER — ZEPBOUND 2.5 MG/0.5ML ~~LOC~~ SOAJ: 2.5000 mg | SUBCUTANEOUS | 0 refills | Status: AC

## 2024-10-22 MED ORDER — ZEPBOUND 5 MG/0.5ML ~~LOC~~ SOAJ: 5.0000 mg | SUBCUTANEOUS | 0 refills | Status: AC

## 2024-10-22 MED ORDER — ZEPBOUND 7.5 MG/0.5ML ~~LOC~~ SOAJ: 7.5000 mg | SUBCUTANEOUS | 0 refills | Status: AC

## 2024-10-22 MED ORDER — ZEPBOUND 7.5 MG/0.5ML ~~LOC~~ SOAJ: 7.5000 mg | SUBCUTANEOUS | 0 refills | Status: DC

## 2024-10-22 MED ORDER — ZEPBOUND 2.5 MG/0.5ML ~~LOC~~ SOAJ: 2.5000 mg | SUBCUTANEOUS | 0 refills | Status: DC

## 2024-10-22 NOTE — Addendum Note (Signed)
 Addended by: FREDIRICK BEAU B on: 10/22/2024 12:43 PM   Modules accepted: Orders

## 2024-10-22 NOTE — Addendum Note (Signed)
 Addended by: FREDIRICK BEAU B on: 10/22/2024 11:32 AM   Modules accepted: Orders

## 2024-11-09 ENCOUNTER — Other Ambulatory Visit (HOSPITAL_BASED_OUTPATIENT_CLINIC_OR_DEPARTMENT_OTHER): Payer: Self-pay | Admitting: Family

## 2024-12-23 ENCOUNTER — Ambulatory Visit: Admitting: Allergy
